# Patient Record
Sex: Male | Born: 1980 | Race: White | Hispanic: No | Marital: Married | Smoking: Never smoker
Health system: Southern US, Community
[De-identification: ages and names within clinical notes are randomized; demographics above are authoritative.]

## PROBLEM LIST (undated history)

## (undated) DIAGNOSIS — I10 Essential (primary) hypertension: Secondary | ICD-10-CM

## (undated) HISTORY — PX: WISDOM TOOTH EXTRACTION: SHX21

---

## 2011-06-05 ENCOUNTER — Encounter (HOSPITAL_COMMUNITY): Payer: Self-pay | Admitting: *Deleted

## 2011-06-05 ENCOUNTER — Emergency Department (HOSPITAL_COMMUNITY)
Admission: EM | Admit: 2011-06-05 | Discharge: 2011-06-05 | Disposition: A | Discharge status: Home / Self Care | Payer: No Typology Code available for payment source | Attending: Emergency Medicine | Admitting: Emergency Medicine

## 2011-06-05 ENCOUNTER — Emergency Department (HOSPITAL_COMMUNITY): Payer: No Typology Code available for payment source

## 2011-06-05 DIAGNOSIS — S161XXA Strain of muscle, fascia and tendon at neck level, initial encounter: Secondary | ICD-10-CM

## 2011-06-05 DIAGNOSIS — M549 Dorsalgia, unspecified: Secondary | ICD-10-CM | POA: Insufficient documentation

## 2011-06-05 DIAGNOSIS — S139XXA Sprain of joints and ligaments of unspecified parts of neck, initial encounter: Secondary | ICD-10-CM | POA: Insufficient documentation

## 2011-06-05 DIAGNOSIS — M542 Cervicalgia: Secondary | ICD-10-CM | POA: Insufficient documentation

## 2011-06-05 MED ORDER — IBUPROFEN 800 MG PO TABS: 800.0000 mg | ORAL_TABLET | Freq: Three times a day (TID) | ORAL | Status: AC

## 2011-06-05 MED ORDER — CYCLOBENZAPRINE HCL 10 MG PO TABS: 10.0000 mg | ORAL_TABLET | Freq: Three times a day (TID) | ORAL | Status: AC | PRN
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
## 2011-06-05 NOTE — ED Provider Notes (Signed)
Medical screening examination/treatment/procedure(s) were performed by non-physician practitioner and as supervising physician I was immediately available for consultation/collaboration.  Shavaughn Seidl R. Fernande Treiber, MD 06/05/11 2344 

## 2011-06-05 NOTE — Discharge Instructions (Signed)
Cervical Sprain and Strain °A cervical sprain is an injury to the neck. The injury can include either over-stretching or even small tears in the ligaments that hold the bones of the neck in place. A strain affects muscles and tendons. Minor injuries usually only involve ligaments and muscles. Because the different parts of the neck are so close together, more severe injuries can involve both sprain and strain. These injuries can affect the muscles, ligaments, tendons, discs, and nerves in the neck. °CAUSES  °An injury may be the result of a direct blow or from certain habits that can lead to the symptoms noted above. °· Injury from:  °· Contact sports (such as football, rugby, wrestling, hockey, auto racing, gymnastics, diving, martial arts, and boxing).  °· Motor vehicle accidents.  °· Whiplash injuries (see image at right). These are common. They occur when the neck is forcefully whipped or forced backward and/or forward.  °· Falls.  °· Lifestyle or awkward postures:  °· Cradling a telephone between the ear and shoulder.  °· Sitting in a chair that offers no support.  °· Working at an ill-designed computer station.  °· Activities that require hours of repeated or long periods of looking up (stretching the neck backward) or looking down (bending the head/neck forward).  °SYMPTOMS  °· Pain, soreness, stiffness, or burning sensation in the front, back, or sides of the neck. This may develop immediately after injury. Onset of discomfort may also develop slowly and not begin for 24 hours or more.  °· Shoulder and/or upper back pain.  °· Limits to the normal movement of the neck.  °· Headache.  °· Dizziness.  °· Weakness and/or abnormal sensation (such as numbness or tingling) of one or both arms and/or hands.  °· Muscle spasm.  °· Difficulty with swallowing or chewing.  °· Tenderness and swelling at the injury site.  °DIAGNOSIS  °Most of the time, your caregiver can diagnose this problem with a careful history and  examination. The history will include information about known problems (such as arthritis in the neck) or a previous neck injury. X-rays may be ordered to find out if there is a different problem. X-rays can also help to find problems with the bones of the neck not related to the injury or current symptoms. °TREATMENT  °Several treatment options are available to help pain, spasm, and other symptoms. They include: °· Cold helps relieve pain and reduce inflammation. Cold should be applied for 10 to 15 minutes every 2 to 3 hours after any activity that aggravates your symptoms. Use ice packs or an ice massage. Place a towel or cloth in between your skin and the ice pack.  °· Medication:  °· Only take over-the-counter or prescription medicines for pain, discomfort, or fever as directed by your caregiver.  °· Pain relievers or muscle relaxants may be prescribed. Use only as directed and only as much as you need.  °· Change in the activity that caused the problem. This might include using a headset with a telephone so that the phone is not propped between your ear and shoulder.  °· Neck collar. Your caregiver may recommend temporary use of a soft cervical collar.  °· Work station. Changes may be needed in your work place. A better sitting position and/or better posture during work may be part of your treatment.  °· Physical Therapy. Your caregiver may recommend physical therapy. This can include instructions in the use of stretching and strengthening exercises. Improvement in posture is important.   Exercises and posture training can help stabilize the neck and strengthen muscles and keep symptoms from returning.  °HOME CARE INSTRUCTIONS  °Other than formal physical therapy, all treatments above can be done at home. Even when not at work, it is important to be conscious of your posture and of activities that can cause a return of symptoms. °Most cervical sprains and/or strains are better in 1-3 weeks. As you improve and  increase activities, doing a warm up and stretching before the activity will help prevent recurrent problems. °SEEK MEDICAL CARE IF:  °· Pain is not effectively controlled with medication.  °· You feel unable to decrease pain medication over time as planned.  °· Activity level is not improving as planned and/or expected.  °SEEK IMMEDIATE MEDICAL CARE IF:  °· While using medication, you develop any bleeding, stomach upset, or signs of an allergic reaction.  °· Symptoms get worse, become intolerable, and are not helped by medications.  °· New, unexplained symptoms develop.  °· You experience numbness, tingling, weakness, or paralysis of any part of your body.  °MAKE SURE YOU:  °· Understand these instructions.  °· Will watch your condition.  °· Will get help right away if you are not doing well or get worse.  °Document Released: 01/20/2007 Document Revised: 12/05/2010 Document Reviewed: 01/20/2007 °ExitCare® Patient Information ©2012 [employer]. °
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
## 2016-01-25 ENCOUNTER — Encounter: Payer: Self-pay | Admitting: Allergy & Immunology

## 2016-01-25 ENCOUNTER — Encounter (INDEPENDENT_AMBULATORY_CARE_PROVIDER_SITE_OTHER): Payer: Self-pay

## 2016-01-25 ENCOUNTER — Ambulatory Visit (INDEPENDENT_AMBULATORY_CARE_PROVIDER_SITE_OTHER): Payer: 59 | Admitting: Allergy & Immunology

## 2016-01-25 VITALS — BP 152/94 | HR 88 | Temp 98.7°F | Resp 18 | Ht 70.0 in | Wt 201.8 lb

## 2016-01-25 DIAGNOSIS — J302 Other seasonal allergic rhinitis: Secondary | ICD-10-CM | POA: Diagnosis not present

## 2016-01-25 DIAGNOSIS — T63481D Toxic effect of venom of other arthropod, accidental (unintentional), subsequent encounter: Secondary | ICD-10-CM | POA: Diagnosis not present

## 2016-01-25 NOTE — Progress Notes (Signed)
NEW PATIENT  Date of Service/Encounter:  01/25/16   Assessment:   Insect stings, accidental or unintentional, subsequent encounter - Plan: Stinging insect panel IgE, Tryptase  Other seasonal allergic rhinitis - Plan: Allergy Test    Plan/Recommendations:   1. Seasonal allergic rhinitis - Testing today showed: Positive to grasses, ragweed, seizure, a few molds, cat, horse, and cockroach - Continue with as needed antihistamines (Allegra and cetirizine are a bit stronger than Claritin, but use whatever works for you)  - You can add a nasal steroid such as Flonase if your symptoms worsen (one spray per nostril daily) - There does not seem to be any indication for environmental allergen immunotherapy as his symptoms are not severe.  2. Insect stings, accidental or unintentional, subsequent encounter - We will send blood work to look for stinging insect allergies since there is a Consulting civil engineer of testing extracts.  - Take your EpiPen with you at all times. - We can talk about allergy shots if the testing is positive. - We did talk briefly about the venom immunotherapy process. - It provides protection against 1-2 stings, otherwise he will still need his epinephrine. - Typically, this is a lifelong therapy as the immunologic effects are less permanent and with environmental immunotherapy.  - This becomes more of a risk versus benefit discussion, as patient is at low risk of stings might not require venom immunotherapy. - Although his career keeps him indoors, he does spend quite a bit of time outdoors in his down time. - Will send serum tryptase today to ensure that he does not have mastocytosis, as there is a higher prevalence of mastocytosis in stinging insect populations.   3. Return in about 6 months (around 07/25/2016) in [place]    Subjective:   Jet Armbrust is a 35 y.o. male presenting today for evaluation of  Chief Complaint  Patient presents with  . Allergic  Reaction    F/U from [employer]  .  Rasmus Preusser has a history of the following: There are no active problems to display for this patient.   History obtained from: chart review and patient.  Laddie Aquas was referred by Rory Percy, MD.     Shawon is a 35 y.o. male presenting for insect sting reaction. He was stung by an insect in the middle of September. He was cutting grass and got stung by a yellow jacket. He looked around and then continued to [facility]. Then he developed generealized pruritis within 20 seconds. He started to black out and then he hobbled inside and went to the shower. He then blacked out again. His wife thankfully was at home and was able to revive him. He asked his wife to call the doctor, and he walked outside and collapsed on the porch. His wife called 57 and EMS came to revive him. The time course between the sting and his blacking out was proximally 15 minutes. He was transferred to the ER and given steroids as well as antihistamines. He was given epinephrine in the field. He was sent home with an EpiPen. In the ER, he was monitored for 4-5 hours before discharge. Prior to this, he had been stung around 2 months ago without problem. He was on several acres of land against stopping periodically each growing season. He has never had a reaction similar to this.  [place] does have a history of seasonal allergies. He takes Claritin as needed. He has never used a nasal steroid. He has never been  allergy tested. His symptoms include itchy nose and nasal congestion. He has no history of asthma. He has no history of urticaria or eczema. He denies problem with flushing his diarrhea and vomiting.  Otherwise, there is no history of other atopic diseases, including asthma, drug allergies, food allergies, or urticaria. There is no significant infectious history. Vaccinations are up to date.    Past Medical History: There are no active problems to display for this patient.   Medication List:      Medication List       Accurate as of 01/25/16  1:46 PM. Always use your most recent med list.          EPINEPHrine 0.3 mg/0.3 mL Soaj injection Commonly known as:  EPI-PEN Inject 0.3 mg into the muscle as needed.       Birth History: non-contributory. Born at term without complications.   Developmental History: [place] has met all milestones on time. He has required no speech therapy, occupational therapy, or physical therapy.   Past Surgical History: Past Surgical History:  Procedure Laterality Date  . WISDOM TOOTH EXTRACTION       Family History: History reviewed. No pertinent family history.   Social History: Aldridge lives at home with His wife, 98-year-old son, and 58-year-old son. There are no animals. 11 house that is 36 years old. There is hardwood in the main living area is carpeting in the bedrooms. They have gas heating and central cooling. A do not have dust mite covers on there pillows. There is no smoke exposure. 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 He does not spend a total of time outdoors, but he does live on several acres and will do yard work on the weekends.   Review of Systems: a 14-point review of systems is pertinent for what is mentioned in HPI.  Otherwise, all other systems were negative. Constitutional: negative other than that listed in the HPI Eyes: negative other than that listed in the HPI Ears, nose, mouth, throat, and face: negative other than that listed in the HPI Respiratory: negative other than that listed in the HPI Cardiovascular: negative other than that listed in the HPI Gastrointestinal: negative other than that listed in the HPI Genitourinary: negative other than that listed in the HPI Integument: negative other than that listed in the HPI Hematologic: negative other than that listed in the HPI Musculoskeletal: negative other than that listed in the HPI Neurological: negative other than that  listed in the HPI Allergy/Immunologic: negative other than that listed in the HPI    Objective:   Blood pressure (!) 152/94, pulse 88, temperature 98.7 F (37.1 C), temperature source Oral, resp. rate 18, height _0  (1.778 m), weight 201 lb 12.8 oz (91.5 kg), SpO2 97 %. Body mass index is 28.96 kg/m.   Physical Exam:  General: Alert, interactive, in no acute distress. Very friendly and smiling. HEENT: TMs pearly gray, turbinates edematous without discharge, post-pharynx mildly erythematous. Neck: Supple without thyromegaly. Adenopathy: no enlarged lymph nodes appreciated in the anterior cervical, occipital, axillary, epitrochlear, inguinal, or popliteal regions Lungs: Clear to auscultation without wheezing, rhonchi or rales. No increased work of breathing. CV: Normal S1/S2, no murmurs. Capillary refill <2 seconds.  Abdomen: Nondistended, nontender. No guarding or rebound tenderness. Bowel sounds faint and hypoactive  Skin: Warm and dry, without lesions or rashes. No dermatographism present.  Extremities:  No clubbing, cyanosis or edema. Neuro:   Grossly intact. No  focal deficits noted.  Diagnostic studies:   Allergy Studies:   Indoor/Outdoor Percutaneous Adult Environmental Panel: Positive to grass, weeds, Cedar, multiple molds, cat, horse, and cockroach.    Salvatore Marvel, MD [facility]

## 2016-01-25 NOTE — Patient Instructions (Addendum)
1. Seasonal allergic rhinitis - Testing today showed: Positive to grasses, ragweed, seizure, a few molds, cat, horse, and cockroach - Continue with as needed antihistamines (Allegra and cetirizine are a bit stronger than Claritin, but use whatever works for you)  - You can add a nasal steroid such as Flonase if your symptoms worsen (one spray per nostril daily)  2. Insect stings, accidental or unintentional, subsequent encounter - We will send blood work to look for stinging insect allergies. - Take your EpiPen with you at all times. - We can talk about allergy shots if the testing is positive.  3. Return in about 6 months (around 07/25/2016). in [place]  Please inform us of any Emergency Department visits, hospitalizations, or changes in symptoms. Call us before going to the ED for breathing or allergy symptoms since we might be able to fit you in for a sick visit. Feel free to contact us anytime with any questions, problems, or concerns.  It was a pleasure to meet you today!   Websites that have reliable patient information: 1. American Academy of Asthma, Allergy, and Immunology: www.aaaai.org 2. Food Allergy Research and Education (FARE): foodallergy.org 3. Mothers of Asthmatics: http://www.asthmacommunitynetwork.org 4. American College of Allergy, Asthma, and Immunology: www.acaai.org  Control of Mold Allergen  Mold and fungi can grow on a variety of surfaces provided certain temperature and moisture conditions exist.  Outdoor molds grow on plants, decaying vegetation and soil.  The major outdoor mold, Alternaria and Cladosporium, are found in very high numbers during hot and dry conditions.  Generally, a late Summer - Fall peak is seen for common outdoor fungal spores.  Rain will temporarily lower outdoor mold spore count, but counts rise rapidly when the rainy period ends.  The most important indoor molds are Aspergillus and Penicillium.  Dark, humid and poorly ventilated basements  are ideal sites for mold growth.  The next most common sites of mold growth are the bathroom and the kitchen.  Outdoor [employer] 1. Use air conditioning and keep windows closed 2. Avoid exposure to decaying vegetation. 3. Avoid leaf raking. 4. Avoid grain handling. 5. Consider wearing a face mask if working in moldy areas.  Indoor Mold Control 1. Maintain humidity below 50%. 2. Clean washable surfaces with 5% bleach solution. 3. Remove sources e.g. contaminated carpets.  Reducing Pollen Exposure  The American Academy of Allergy, Asthma and Immunology suggests the following steps to reduce your exposure to pollen during allergy seasons.    1. Do not hang sheets or clothing out to dry; pollen may collect on these items. 2. Do not mow lawns or spend time around freshly cut grass; mowing stirs up pollen. 3. Keep windows closed at night.  Keep car windows closed while driving. 4. Minimize morning activities outdoors, a time when pollen counts are usually at their highest. 5. Stay indoors as much as possible when pollen counts or humidity is high and on windy days when pollen tends to remain in the air longer. 6. Use air conditioning when possible.  Many air conditioners have filters that trap the pollen spores. 7. Use a HEPA room air filter to remove pollen form the indoor air you breathe.  Control of Cockroach Allergen  Cockroach allergen has been identified as an important cause of acute attacks of asthma, especially in urban settings.  There are fifty-five species of cockroach that exist in the [place], however only three, the [place], [place] species produce allergen that can affect patients with Asthma.  Allergens can be obtained from fecal particles, egg casings and secretions from cockroaches.    1. Remove food sources. 2. Reduce access to water. 3. Seal access and entry points. 4. Spray runways with 0.5-1% Diazinon or Chlorpyrifos 5. Blow boric acid power  under stoves and refrigerator. 6. Place bait stations (hydramethylnon) at feeding sites.

## 2016-01-26 LAB — TRYPTASE: TRYPTASE: 2.4 ug/L (ref 2.2–13.2)

## 2016-02-06 LAB — SPECIMEN STATUS REPORT

## 2016-02-06 LAB — ALLERGEN STINGING INSECT PANEL
I002-IGE HORNET, WHITE FACE: 9.32 kU/L — AB
I003-IGE YELLOW JACKET: 32.9 kU/L — AB
I004-IGE PAPER WASP: 12.2 kU/L — AB
I005-IGE HORNET, YELLOW: 3.96 kU/L — AB

## 2019-06-24 ENCOUNTER — Ambulatory Visit: Payer: Self-pay | Attending: Internal Medicine

## 2019-06-24 DIAGNOSIS — Z23 Encounter for immunization: Secondary | ICD-10-CM

## 2019-06-24 NOTE — Progress Notes (Signed)
   Covid-19 Vaccination Clinic  Name:  John Montoya    MRN: [id number] DOB: 1981-03-01  06/24/2019  John Montoya was observed post Covid-19 immunization for 15 minutes without incident. He was provided with Vaccine Information Sheet and instruction to access the V-Safe system.   John Montoya was instructed to call 911 with any severe reactions post vaccine: Marland Kitchen Difficulty breathing  . Swelling of face and throat  . A fast heartbeat  . A bad rash all over body  . Dizziness and weakness   Immunizations Administered    Name Date Dose VIS Date Route   Pfizer COVID-19 Vaccine 06/24/2019  9:04 AM 0.3 mL 03/19/2019 Intramuscular   Manufacturer: [place]   Lot: [id number]   [place]: [id number]

## 2019-07-19 ENCOUNTER — Ambulatory Visit: Payer: Self-pay | Attending: Internal Medicine

## 2019-07-19 DIAGNOSIS — Z23 Encounter for immunization: Secondary | ICD-10-CM

## 2019-07-19 NOTE — Progress Notes (Signed)
   Covid-19 Vaccination Clinic  Name:  Jeshuah Bruderer    MRN: [id number] DOB: May 12, 1980  07/19/2019  Mr. Eckerson was observed post Covid-19 immunization for 15 minutes without incident. He was provided with Vaccine Information Sheet and instruction to access the V-Safe system.   Mr. Navarette was instructed to call 911 with any severe reactions post vaccine: Marland Kitchen Difficulty breathing  . Swelling of face and throat  . A fast heartbeat  . A bad rash all over body  . Dizziness and weakness   Immunizations Administered    Name Date Dose VIS Date Route   Pfizer COVID-19 Vaccine 07/19/2019  8:42 AM 0.3 mL 03/19/2019 Intramuscular   Manufacturer: [place]   Lot: [id number]   [place]: [id number]

## 2020-05-30 ENCOUNTER — Encounter (INDEPENDENT_AMBULATORY_CARE_PROVIDER_SITE_OTHER): Payer: Self-pay | Admitting: *Deleted

## 2020-08-28 ENCOUNTER — Other Ambulatory Visit: Payer: Self-pay

## 2020-08-28 ENCOUNTER — Telehealth (INDEPENDENT_AMBULATORY_CARE_PROVIDER_SITE_OTHER): Payer: 59 | Admitting: Gastroenterology

## 2020-08-28 ENCOUNTER — Encounter (INDEPENDENT_AMBULATORY_CARE_PROVIDER_SITE_OTHER): Payer: Self-pay | Admitting: Gastroenterology

## 2020-08-28 ENCOUNTER — Ambulatory Visit (INDEPENDENT_AMBULATORY_CARE_PROVIDER_SITE_OTHER): Payer: Self-pay | Admitting: Gastroenterology

## 2020-08-28 DIAGNOSIS — K625 Hemorrhage of anus and rectum: Secondary | ICD-10-CM | POA: Insufficient documentation

## 2020-08-28 DIAGNOSIS — Z86010 Personal history of colonic polyps: Secondary | ICD-10-CM

## 2020-08-28 NOTE — Patient Instructions (Signed)
Schedule colonoscopy

## 2020-08-28 NOTE — Progress Notes (Signed)
Maylon Peppers, M.D. Gastroenterology & Hepatology [facility] [address] [place], [place] [address] Primary Care Physician: Rory Percy, MD [place] [id number]  Referring MD: PCP  This is a telephone virtual visit.  It required patient-provider interaction for the medical decision making as documented below. The patient has consented and agreed to proceed with a Telehealth encounter given the current Coronavirus pandemic.  VIRTUAL VISIT NOTE Patient location: home  Provider location: home  I will communicate my assessment and recommendations to the referring MD via EMR.  Chief Complaint:  Rectal bleeding  History of Present Illness: John Montoya is a 40 y.o. male with no significant past medical history, who presents for evaluation of rectal bleeding.  Patient reports that when he was 40 years old he presented new onset  rectal bleeding. He had a colonoscopy at that time and reports he had some benign polyps removed. No report is available.  He states that he was told at that time it was likely due to hemorrhoids. States that since then he has presented intermittent episodes of rectal bleeding throughout his life. States that he has presented intermittent fresh large amount of bleeding for multiple years, but has noticed that it is heavier in amount compared to previous years. Last episode was 3 months ago and lasted for almost a week.  The patient states that he has had 2 colonoscopies after his initial colonoscopy, with last one performed 12 years ago. He believes he had some polyps but no report is available.  He has a BM 1-2 times a day. Denies any straining when moving his bowels.  He states that in the last couple of months he has presented some episodes of pain after eating, but this lasts for a few minutes. This is located in the periumbilical area. He describes this as a "turning" pain as if he had food  poisoning.  He denies having any significant concern about this abdominal pain.  The patient denies having any nausea, vomiting, fever, chills, hematochezia, melena, hematemesis, abdominal distention,  diarrhea, jaundice, pruritus or weight loss.  Last RSW:NIOEV  FHx: neg for any gastrointestinal/liver disease, colon cancer uncle in his 38s Social: neg smoking, alcohol or illicit drug use Surgical: non contributory  Past Medical History:History reviewed. No pertinent past medical history.  Past Surgical History: Past Surgical History:  Procedure Laterality Date  . WISDOM TOOTH EXTRACTION      Family History:History reviewed. No pertinent family history.  Social History: Social History   Tobacco Use  Smoking Status Never Smoker  Smokeless Tobacco Never Used   Social History   Substance and Sexual Activity  Alcohol Use Yes  . Alcohol/week: 3.0 standard drinks  . Types: 2 Cans of beer, 1 Shots of liquor per week   Comment: socially   Social History   Substance and Sexual Activity  Drug Use No    Allergies: Allergies  Allergen Reactions  . Bee Venom Anaphylaxis    Medications: Current Outpatient Medications  Medication Sig Dispense Refill  . EPINEPHrine 0.3 mg/0.3 mL IJ SOAJ injection Inject 0.3 mg into the muscle as needed.     No current facility-administered medications for this visit.    Review of Systems: GENERAL: negative for malaise, night sweats HEENT: No changes in hearing or vision, no nose bleeds or other nasal problems. NECK: Negative for lumps, goiter, pain and significant neck swelling RESPIRATORY: Negative for cough, wheezing CARDIOVASCULAR: Negative for chest pain, leg swelling,  palpitations, orthopnea GI: SEE HPI MUSCULOSKELETAL: Negative for joint pain or swelling, back pain, and muscle pain. SKIN: Negative for lesions, rash PSYCH: Negative for sleep disturbance, mood disorder and recent psychosocial stressors. HEMATOLOGY Negative for  prolonged bleeding, bruising easily, and swollen nodes. ENDOCRINE: Negative for cold or heat intolerance, polyuria, polydipsia and goiter. NEURO: negative for tremor, gait imbalance, syncope and seizures. The remainder of the review of systems is noncontributory.   Physical Exam: No exam was performed as this was a telephone encounter  Imaging/Labs: as above  I personally reviewed and interpreted the available labs, imaging and endoscopic files.  Impression and Plan: John Montoya is a 40 y.o. male with no significant past medical history, who presents for evaluation of rectal bleeding.  The patient has had longstanding history of rectal bleeding intermittently throughout his life.  He has not presented any other red flag signs and I think this is likely related to some intermittent hemorrhoidal bleeding.  However as he has presented polyps in the past and he has not had colonoscopy in more than 10 years, we will proceed with a repeat colonoscopy.  I explained to the patient that if he has hemorrhoids, he may be a candidate for band ligation versus surgical hemorrhoidectomy but he is not interested in pursuing this as the bleeding is not bothersome for him and his not very frequent, so he would not like to proceed with any therapeutic treatment for possible hemorrhoids.DX  - Schedule colonoscopy All questions were answered.      Total visit time: I spent a total of  35 minutes  Maylon Peppers, MD Gastroenterology and Hepatology [facility]

## 2021-01-01 ENCOUNTER — Other Ambulatory Visit (INDEPENDENT_AMBULATORY_CARE_PROVIDER_SITE_OTHER): Payer: Self-pay

## 2021-01-01 ENCOUNTER — Encounter (INDEPENDENT_AMBULATORY_CARE_PROVIDER_SITE_OTHER): Payer: Self-pay

## 2021-01-01 ENCOUNTER — Telehealth (INDEPENDENT_AMBULATORY_CARE_PROVIDER_SITE_OTHER): Payer: Self-pay

## 2021-01-01 DIAGNOSIS — K625 Hemorrhage of anus and rectum: Secondary | ICD-10-CM

## 2021-01-01 MED ORDER — PEG 3350-KCL-NA BICARB-NACL 420 G PO SOLR: 4000.0000 mL | ORAL | 0 refills | Status: DC

## 2021-01-01 NOTE — Telephone Encounter (Signed)
John Montoya, CMA  

## 2021-01-24 ENCOUNTER — Encounter (HOSPITAL_COMMUNITY)
Admission: RE | Disposition: A | Discharge status: Home / Self Care | Payer: Self-pay | Source: Home / Self Care | Attending: Gastroenterology

## 2021-01-24 ENCOUNTER — Other Ambulatory Visit: Payer: Self-pay

## 2021-01-24 ENCOUNTER — Ambulatory Visit (HOSPITAL_COMMUNITY)
Admission: RE | Admit: 2021-01-24 | Discharge: 2021-01-24 | Disposition: A | Discharge status: Home / Self Care | Payer: 59 | Attending: Gastroenterology | Admitting: Gastroenterology

## 2021-01-24 ENCOUNTER — Ambulatory Visit (HOSPITAL_COMMUNITY): Payer: 59 | Admitting: Anesthesiology

## 2021-01-24 ENCOUNTER — Encounter (HOSPITAL_COMMUNITY): Payer: Self-pay | Admitting: Gastroenterology

## 2021-01-24 DIAGNOSIS — D122 Benign neoplasm of ascending colon: Secondary | ICD-10-CM | POA: Insufficient documentation

## 2021-01-24 DIAGNOSIS — Z791 Long term (current) use of non-steroidal anti-inflammatories (NSAID): Secondary | ICD-10-CM | POA: Insufficient documentation

## 2021-01-24 DIAGNOSIS — K5730 Diverticulosis of large intestine without perforation or abscess without bleeding: Secondary | ICD-10-CM | POA: Diagnosis not present

## 2021-01-24 DIAGNOSIS — Z79899 Other long term (current) drug therapy: Secondary | ICD-10-CM | POA: Insufficient documentation

## 2021-01-24 DIAGNOSIS — K625 Hemorrhage of anus and rectum: Secondary | ICD-10-CM | POA: Insufficient documentation

## 2021-01-24 DIAGNOSIS — D124 Benign neoplasm of descending colon: Secondary | ICD-10-CM | POA: Diagnosis not present

## 2021-01-24 DIAGNOSIS — K648 Other hemorrhoids: Secondary | ICD-10-CM | POA: Diagnosis not present

## 2021-01-24 DIAGNOSIS — D120 Benign neoplasm of cecum: Secondary | ICD-10-CM | POA: Diagnosis not present

## 2021-01-24 DIAGNOSIS — D1339 Benign neoplasm of other parts of small intestine: Secondary | ICD-10-CM

## 2021-01-24 DIAGNOSIS — Z91030 Bee allergy status: Secondary | ICD-10-CM | POA: Insufficient documentation

## 2021-01-24 HISTORY — PX: POLYPECTOMY: SHX149

## 2021-01-24 HISTORY — PX: HEMOSTASIS CLIP PLACEMENT: SHX6857

## 2021-01-24 HISTORY — PX: COLONOSCOPY WITH PROPOFOL: SHX5780

## 2021-01-24 LAB — HM COLONOSCOPY

## 2021-01-24 SURGERY — COLONOSCOPY WITH PROPOFOL
Anesthesia: General

## 2021-01-24 MED ORDER — PROPOFOL 1000 MG/100ML IV EMUL
INTRAVENOUS | Status: AC
  Filled 2021-01-24: qty 100

## 2021-01-24 MED ORDER — LACTATED RINGERS IV SOLN: INTRAVENOUS | Status: DC

## 2021-01-24 MED ORDER — PROPOFOL 500 MG/50ML IV EMUL
INTRAVENOUS | Status: DC | PRN
  Administered 2021-01-24: 150 ug/kg/min via INTRAVENOUS

## 2021-01-24 MED ORDER — PROPOFOL 10 MG/ML IV BOLUS
INTRAVENOUS | Status: DC | PRN
  Administered 2021-01-24: 100 mg via INTRAVENOUS

## 2021-01-24 NOTE — H&P (Signed)
John Montoya is an 40 y.o. male.   Chief Complaint: Rectal bleeding HPI: John Montoya is a 40 y.o. male with no significant past medical history, who presents for evaluation of rectal bleeding.  Patient reports that for the last 10 years he has presented intermittent episodes of fresh blood in his stool with no dyschezia or melena.  He denies having any abdominal pain, nausea, vomiting, fever, chills, weight loss, abdominal distention.  The patient states that he has had 2 colonoscopies after his initial colonoscopy, with last one performed 12 years ago. He believes he had some polyps but no report is available.  History reviewed. No pertinent past medical history.  Past Surgical History:  Procedure Laterality Date   WISDOM TOOTH EXTRACTION      History reviewed. No pertinent family history. Social History:  reports that he has never smoked. He has never used smokeless tobacco. He reports current alcohol use of about 3.0 standard drinks per week. He reports that he does not use drugs.  Allergies:  Allergies  Allergen Reactions   Bee Venom Anaphylaxis    Medications Prior to Admission  Medication Sig Dispense Refill   Aspirin-Acetaminophen-Caffeine 500-325-65 MG PACK Take by mouth.     busPIRone (BUSPAR) 5 MG tablet Take 5 mg by mouth 2 (two) times daily.     EPINEPHrine 0.3 mg/0.3 mL IJ SOAJ injection Inject 0.3 mg into the muscle as needed for anaphylaxis.     lisinopril (ZESTRIL) 20 MG tablet Take 20 mg by mouth daily.     polyethylene glycol-electrolytes (TRILYTE) 420 g solution Take 4,000 mLs by mouth as directed. 4000 mL 0   acetaminophen (TYLENOL) 500 MG tablet Take 1,000 mg by mouth every 8 (eight) hours as needed for headache.     ibuprofen (ADVIL) 200 MG tablet Take 400 mg by mouth every 8 (eight) hours as needed for headache.      No results found for this or any previous visit (from the past 48 hour(s)). No results found.  Review of Systems  Constitutional: Negative.    HENT: Negative.    Eyes: Negative.   Respiratory: Negative.    Cardiovascular: Negative.   Gastrointestinal:  Positive for blood in stool.  Endocrine: Negative.   Genitourinary: Negative.   Musculoskeletal: Negative.   Skin: Negative.   Allergic/Immunologic: Negative.   Neurological: Negative.   Hematological: Negative.   Psychiatric/Behavioral: Negative.     Blood pressure (!) 155/98, pulse 87, temperature 98.1 F (36.7 C), temperature source Oral, resp. rate 19, height 6' (1.829 m), weight 92.5 kg, SpO2 100 %. Physical Exam  GENERAL: The patient is AO x3, in no acute distress. HEENT: Head is normocephalic and atraumatic. EOMI are intact. Mouth is well hydrated and without lesions. NECK: Supple. No masses LUNGS: Clear to auscultation. No presence of rhonchi/wheezing/rales. Adequate chest expansion HEART: RRR, normal s1 and s2. ABDOMEN: Soft, nontender, no guarding, no peritoneal signs, and nondistended. BS +. No masses. EXTREMITIES: Without any cyanosis, clubbing, rash, lesions or edema. NEUROLOGIC: AOx3, no focal motor deficit. SKIN: no jaundice, no rashes  Assessment/Plan John Montoya is a 40 y.o. male with no significant past medical history, who presents for evaluation of rectal bleeding.  We will proceed with colonoscopy.  Harvel Quale, MD 01/24/2021, 8:48 AM

## 2021-01-24 NOTE — Anesthesia Postprocedure Evaluation (Signed)
Anesthesia Post Note  Patient: John Montoya  Procedure(s) Performed: COLONOSCOPY WITH PROPOFOL POLYPECTOMY INTESTINAL HEMOSTASIS CLIP PLACEMENT  Patient location during evaluation: Endoscopy Anesthesia Type: General Level of consciousness: awake Pain management: pain level controlled Vital Signs Assessment: post-procedure vital signs reviewed and stable Respiratory status: spontaneous breathing Cardiovascular status: blood pressure returned to baseline and stable Postop Assessment: no apparent nausea or vomiting Anesthetic complications: no   No notable events documented.   Last Vitals:  Vitals:   01/24/21 0815  BP: (!) 155/98  Pulse: 87  Resp: 19  Temp: 36.7 C  SpO2: 100%    Last Pain:  Vitals:   01/24/21 0952  TempSrc: Oral  PainSc: 0-No pain                 Dajane Valli

## 2021-01-24 NOTE — Discharge Instructions (Addendum)
-   Discharge patient to home (ambulatory).  - Resume previous diet.  - Await pathology results.  - Repeat colonoscopy in 2 years for surveillance.  - May consider referral for genetic counseling depending on pathology report. - Can start taking Miralax daily to loosen bowel movement

## 2021-01-24 NOTE — Transfer of Care (Signed)
Immediate Anesthesia Transfer of Care Note  Patient: John Montoya  Procedure(s) Performed: COLONOSCOPY WITH PROPOFOL POLYPECTOMY INTESTINAL HEMOSTASIS CLIP PLACEMENT  Patient Location: Endoscopy Unit  Anesthesia Type:General  Level of Consciousness: awake  Airway & Oxygen Therapy: Patient Spontanous Breathing  Post-op Assessment: Report given to RN  Post vital signs: Reviewed  Last Vitals:  Vitals Value Taken Time  BP    Temp    Pulse    Resp    SpO2      Last Pain:  Vitals:   01/24/21 0952  TempSrc: Oral  PainSc: 0-No pain      Patients Stated Pain Goal: 7 (52/71/29 2909)  Complications: No notable events documented.

## 2021-01-24 NOTE — Anesthesia Preprocedure Evaluation (Signed)

## 2021-01-24 NOTE — Op Note (Signed)
[facility] Patient Name: John Montoya Procedure Date: 01/24/2021 8:45 AM MRN: [id number] Date of Birth: February 29, 1981 Attending MD: Maylon Peppers ,  CSN: [id number] Age: 40 Admit Type: Outpatient Procedure:                Colonoscopy Indications:              Rectal bleeding Providers:                Maylon Peppers, [place] Page, [place] Risa Grill, Technician Referring MD:              Medicines:                Monitored Anesthesia Care Complications:            No immediate complications. Estimated Blood Loss:     Estimated blood loss: none. Procedure:                Pre-Anesthesia Assessment:                           - Prior to the procedure, a History and Physical                            was performed, and patient medications, allergies                            and sensitivities were reviewed. The patient's                            tolerance of previous anesthesia was reviewed.                           - The risks and benefits of the procedure and the                            sedation options and risks were discussed with the                            patient. All questions were answered and informed                            consent was obtained.                           - ASA Grade Assessment: I - A normal, healthy                            patient.                           After obtaining informed consent, the colonoscope                            was passed under direct vision. Throughout the  procedure, the patient's blood pressure, pulse, and                            oxygen saturations were monitored continuously. The                            [id number]-HQ190L ([id number]) scope was introduced through                            the anus and advanced to the the terminal ileum.                            The colonoscopy was performed without difficulty.                            The patient tolerated  the procedure well. The                            quality of the bowel preparation was good. Scope In: 9:07:35 AM Scope Out: 9:46:45 AM Scope Withdrawal Time: 0 hours 32 minutes 57 seconds  Total Procedure Duration: 0 hours 39 minutes 10 seconds  Findings:      The perianal and digital rectal examinations were normal.      The terminal ileum appeared normal.      Six sessile polyps were found in the cecum. The polyps were 2 to 4 mm in       size. These polyps were removed with a cold snare. Resection and       retrieval were complete.      Ten semi-sessile polyps were found in the cecum and ileocecal valve. The       polyps were 1 to 2 mm in size. These polyps were removed with a cold       biopsy forceps. Resection and retrieval were complete.      A 8 mm polyp was found in the ascending colon. The polyp was       semi-sessile. The polyp was removed with a cold snare. Resection and       retrieval were complete.      A 20 mm polyp was found in the descending colon. The polyp was       pedunculated. To prevent bleeding post-intervention, one hemostatic clip       was successfully placed at the base of the stalk. The polyp was removed       with a hot snare. Resection and retrieval were complete. There was no       bleeding at the end of the procedure.      A few small-mouthed diverticula were found in the sigmoid colon and       descending colon.      Non-bleeding internal hemorrhoids were found during retroflexion. The       hemorrhoids were small. Impression:               - The examined portion of the ileum was normal.                           - Six 2 to 4 mm polyps in the cecum, removed with a  cold snare. Resected and retrieved.                           - Ten 1 to 2 mm polyps in the cecum and at the                            ileocecal valve, removed with a cold biopsy                            forceps. Resected and retrieved.                            - One 8 mm polyp in the ascending colon, removed                            with a cold snare. Resected and retrieved.                           - One 20 mm polyp in the descending colon, removed                            with a hot snare. Resected and retrieved. Clip was                            placed.                           - Diverticulosis in the sigmoid colon and in the                            descending colon.                           - Non-bleeding internal hemorrhoids. Moderate Sedation:      Per Anesthesia Care Recommendation:           - Discharge patient to home (ambulatory).                           - Resume previous diet.                           - Await pathology results.                           - Repeat colonoscopy in 2 years for surveillance.                           - May consider referral for genetic counseling                            depending on pathology report. Procedure Code(s):        --- Professional ---                           [phone], Colonoscopy, flexible;  with removal of                            tumor(s), polyp(s), or other lesion(s) by snare                            technique                           45380, 59, Colonoscopy, flexible; with biopsy,                            single or multiple Diagnosis Code(s):        --- Professional ---                           K64.8, Other hemorrhoids                           K63.5, Polyp of colon                           K62.5, Hemorrhage of anus and rectum                           K57.30, Diverticulosis of large intestine without                            perforation or abscess without bleeding CPT copyright 2019 American Medical Association. All rights reserved. The codes documented in this report are preliminary and upon coder review may  be revised to meet current compliance requirements. Maylon Peppers, MD Maylon Peppers,  01/24/2021 9:57:18 AM This report has been signed  electronically. Number of Addenda: 0

## 2021-01-25 LAB — SURGICAL PATHOLOGY

## 2021-01-26 ENCOUNTER — Encounter (HOSPITAL_COMMUNITY): Payer: Self-pay | Admitting: Gastroenterology

## 2021-01-27 ENCOUNTER — Observation Stay (HOSPITAL_COMMUNITY)
Admission: EM | Admit: 2021-01-27 | Discharge: 2021-01-28 | Disposition: A | Discharge status: Home / Self Care | Payer: 59 | Attending: Internal Medicine | Admitting: Internal Medicine

## 2021-01-27 ENCOUNTER — Other Ambulatory Visit: Payer: Self-pay

## 2021-01-27 ENCOUNTER — Encounter (HOSPITAL_COMMUNITY): Payer: Self-pay | Admitting: Emergency Medicine

## 2021-01-27 DIAGNOSIS — K625 Hemorrhage of anus and rectum: Principal | ICD-10-CM | POA: Insufficient documentation

## 2021-01-27 DIAGNOSIS — K922 Gastrointestinal hemorrhage, unspecified: Secondary | ICD-10-CM

## 2021-01-27 DIAGNOSIS — Z20822 Contact with and (suspected) exposure to covid-19: Secondary | ICD-10-CM | POA: Insufficient documentation

## 2021-01-27 DIAGNOSIS — I10 Essential (primary) hypertension: Secondary | ICD-10-CM | POA: Diagnosis not present

## 2021-01-27 DIAGNOSIS — Z79899 Other long term (current) drug therapy: Secondary | ICD-10-CM | POA: Diagnosis not present

## 2021-01-27 LAB — COMPREHENSIVE METABOLIC PANEL
ALT: 37 U/L (ref 0–44)
AST: 26 U/L (ref 15–41)
Albumin: 4 g/dL (ref 3.5–5.0)
Alkaline Phosphatase: 70 U/L (ref 38–126)
Anion gap: 6 (ref 5–15)
BUN: 15 mg/dL (ref 6–20)
CO2: 28 mmol/L (ref 22–32)
Calcium: 8.7 mg/dL — ABNORMAL LOW (ref 8.9–10.3)
Chloride: 103 mmol/L (ref 98–111)
Creatinine, Ser: 1.2 mg/dL (ref 0.61–1.24)
GFR, Estimated: >60 mL/min (ref >60)
Glucose, Bld: 100 mg/dL — ABNORMAL HIGH (ref 70–99)
Potassium: 4 mmol/L (ref 3.5–5.1)
Sodium: 137 mmol/L (ref 135–145)
Total Bilirubin: 0.7 mg/dL (ref 0.3–1.2)
Total Protein: 7.1 g/dL (ref 6.5–8.1)

## 2021-01-27 LAB — HIV ANTIBODY (ROUTINE TESTING W REFLEX): HIV Screen 4th Generation wRfx: NONREACTIVE

## 2021-01-27 LAB — TYPE AND SCREEN
ABO/RH(D): A POS
Antibody Screen: NEGATIVE

## 2021-01-27 LAB — CBC
HCT: 44.5 % (ref 39.0–52.0)
Hemoglobin: 15.3 g/dL (ref 13.0–17.0)
MCH: 30.1 pg (ref 26.0–34.0)
MCHC: 34.4 g/dL (ref 30.0–36.0)
MCV: 87.4 fL (ref 80.0–100.0)
Platelets: 215 10*3/uL (ref 150–400)
RBC: 5.09 MIL/uL (ref 4.22–5.81)
RDW: 12.4 % (ref 11.5–15.5)
WBC: 5.3 10*3/uL (ref 4.0–10.5)
nRBC: 0 % (ref 0.0–0.2)

## 2021-01-27 LAB — RESP PANEL BY RT-PCR (FLU A&B, COVID) ARPGX2
Influenza A by PCR: NEGATIVE
Influenza B by PCR: NEGATIVE
SARS Coronavirus 2 by RT PCR: NEGATIVE

## 2021-01-27 MED ORDER — ONDANSETRON HCL 4 MG PO TABS: 4.0000 mg | ORAL_TABLET | Freq: Four times a day (QID) | ORAL | Status: DC | PRN

## 2021-01-27 MED ORDER — ONDANSETRON HCL 4 MG/2ML IJ SOLN: 4.0000 mg | Freq: Four times a day (QID) | INTRAMUSCULAR | Status: DC | PRN

## 2021-01-27 MED ORDER — LISINOPRIL 10 MG PO TABS
20.0000 mg | ORAL_TABLET | Freq: Every day | ORAL | Status: DC
  Administered 2021-01-27 – 2021-01-28 (×2): 20 mg via ORAL
  Filled 2021-01-27 (×2): qty 2

## 2021-01-27 MED ORDER — SODIUM CHLORIDE 0.9 % IV SOLN: 250.0000 mL | INTRAVENOUS | Status: DC | PRN

## 2021-01-27 MED ORDER — SODIUM CHLORIDE 0.9% FLUSH: 3.0000 mL | INTRAVENOUS | Status: DC | PRN

## 2021-01-27 MED ORDER — BUSPIRONE HCL 5 MG PO TABS
5.0000 mg | ORAL_TABLET | Freq: Two times a day (BID) | ORAL | Status: DC
  Administered 2021-01-27: 5 mg via ORAL
  Filled 2021-01-27 (×3): qty 1

## 2021-01-27 MED ORDER — HYDRALAZINE HCL 20 MG/ML IJ SOLN: 10.0000 mg | INTRAMUSCULAR | Status: DC | PRN

## 2021-01-27 MED ORDER — ACETAMINOPHEN 500 MG PO TABS
1000.0000 mg | ORAL_TABLET | Freq: Three times a day (TID) | ORAL | Status: DC | PRN
  Administered 2021-01-27: 1000 mg via ORAL
  Filled 2021-01-27: qty 2

## 2021-01-27 MED ORDER — SODIUM CHLORIDE 0.9 % IV SOLN: INTRAVENOUS | Status: DC

## 2021-01-27 MED ORDER — SODIUM CHLORIDE 0.9% FLUSH
3.0000 mL | Freq: Two times a day (BID) | INTRAVENOUS | Status: DC
  Administered 2021-01-27: 3 mL via INTRAVENOUS

## 2021-01-27 NOTE — ED Provider Notes (Signed)
[facility] EMERGENCY DEPARTMENT Provider Note   CSN: [id number] Arrival date & time: 01/27/21  0818     History Chief Complaint  Patient presents with   Rectal Bleeding    John Montoya is a 40 y.o. male.  Patient c/o rectal bleeding acute onset this AM, moderate, recurrent. States had colonoscopy 10/19. States hx intermittent small amount brbpr, internal hemorrhoids, and colon polyps - notes three prior colonoscopies. Had no rectal bleeding prior to recent colonoscopy, but now is having new bleeding, that is more than the minimal amount of bleeding he's had previously. No faintness or dizziness. No constant and/or focal abd pain. No abd distension. No nausea/vomiting. No fever or chills. No anticoag use. No other abnormal bruising or bleeding. Recent colonoscopy with removal of several polyps and with clip applied to larger polyp, as well as demonstration diverticula and internal hemorrhoids.   The history is provided by the patient and medical records.  Rectal Bleeding Associated symptoms: no fever, no light-headedness and no vomiting       History reviewed. No pertinent past medical history.  Patient Active Problem List   Diagnosis Date Noted   Rectal bleeding 08/28/2020   History of colonic polyps 08/28/2020    Past Surgical History:  Procedure Laterality Date   COLONOSCOPY WITH PROPOFOL N/A 01/24/2021   Procedure: COLONOSCOPY WITH PROPOFOL;  Surgeon: Harvel Quale, MD;  Location: [facility] ENDO SUITE;  Service: Gastroenterology;  Laterality: N/A;  9:15   HEMOSTASIS CLIP PLACEMENT  01/24/2021   Procedure: HEMOSTASIS CLIP PLACEMENT;  Surgeon: Harvel Quale, MD;  Location: [facility] ENDO SUITE;  Service: Gastroenterology;;   POLYPECTOMY  01/24/2021   Procedure: POLYPECTOMY INTESTINAL;  Surgeon: Harvel Quale, MD;  Location: [facility] ENDO SUITE;  Service: Gastroenterology;;   WISDOM TOOTH EXTRACTION         No family history on file.  Social History    Tobacco Use   Smoking status: Never   Smokeless tobacco: Never  Vaping Use   Vaping Use: Never used  Substance Use Topics   Alcohol use: Yes    Alcohol/week: 3.0 standard drinks    Types: 2 Cans of beer, 1 Shots of liquor per week    Comment: socially   Drug use: No    Home Medications Prior to Admission medications   Medication Sig Start Date End Date Taking? Authorizing Provider  acetaminophen (TYLENOL) 500 MG tablet Take 1,000 mg by mouth every 8 (eight) hours as needed for headache.   Yes [provider]  busPIRone (BUSPAR) 5 MG tablet Take 5 mg by mouth 2 (two) times daily.   Yes [provider]  ibuprofen (ADVIL) 200 MG tablet Take 400 mg by mouth every 8 (eight) hours as needed for headache.   Yes [provider]  lisinopril (ZESTRIL) 20 MG tablet Take 20 mg by mouth daily.   Yes [provider]  EPINEPHrine 0.3 mg/0.3 mL IJ SOAJ injection Inject 0.3 mg into the muscle as needed for anaphylaxis. 12/23/15   [provider]    Allergies    Bee venom  Review of Systems   Review of Systems  Constitutional:  Negative for fever.  HENT:  Negative for sore throat.   Eyes:  Negative for redness.  Respiratory:  Negative for shortness of breath.   Cardiovascular:  Negative for chest pain.  Gastrointestinal:  Positive for blood in stool and hematochezia. Negative for vomiting.  Genitourinary:  Negative for flank pain and hematuria.  Musculoskeletal:  Negative for  back pain.  Skin:  Negative for rash.  Neurological:  Negative for syncope and light-headedness.  Hematological:  Does not bruise/bleed easily.  Psychiatric/Behavioral:  Negative for confusion.    Physical Exam Updated Vital Signs BP (!) 156/100 (BP Location: Left Arm)   Pulse 81   Temp 97.8 F (36.6 C) (Oral)   Resp 18   Ht 1.829 m (6')   Wt 90.7 kg   SpO2 100%   BMI 27.12 kg/m   Physical Exam Vitals and nursing note reviewed.  Constitutional:       Appearance: Normal appearance. He is well-developed.  HENT:     Head: Atraumatic.     Nose: Nose normal.     Mouth/Throat:     Mouth: Mucous membranes are moist.     Pharynx: Oropharynx is clear.  Eyes:     General: No scleral icterus.    Conjunctiva/sclera: Conjunctivae normal.  Neck:     Trachea: No tracheal deviation.  Cardiovascular:     Rate and Rhythm: Normal rate.     Pulses: Normal pulses.  Pulmonary:     Effort: Pulmonary effort is normal. No accessory muscle usage or respiratory distress.  Abdominal:     General: Bowel sounds are normal. There is no distension.     Palpations: Abdomen is soft. There is no mass.     Tenderness: There is no abdominal tenderness. There is no guarding.  Genitourinary:    Comments: No cva tenderness. Musculoskeletal:        General: No swelling.     Cervical back: Normal range of motion.  Skin:    General: Skin is warm and dry.     Findings: No rash.  Neurological:     Mental Status: He is alert.     Comments: Alert, speech clear.   Psychiatric:        Mood and Affect: Mood normal.    ED Results / Procedures / Treatments   Labs (all labs ordered are listed, but only abnormal results are displayed) Results for orders placed or performed during the hospital encounter of 01/27/21  CBC  Result Value Ref Range   WBC 5.3 4.0 - 10.5 K/uL   RBC 5.09 4.22 - 5.81 MIL/uL   Hemoglobin 15.3 13.0 - 17.0 g/dL   HCT 44.5 39.0 - 52.0 %   MCV 87.4 80.0 - 100.0 fL   MCH 30.1 26.0 - 34.0 pg   MCHC 34.4 30.0 - 36.0 g/dL   RDW 12.4 11.5 - 15.5 %   Platelets 215 150 - 400 K/uL   nRBC 0.0 0.0 - 0.2 %  Comprehensive metabolic panel  Result Value Ref Range   Sodium 137 135 - 145 mmol/L   Potassium 4.0 3.5 - 5.1 mmol/L   Chloride 103 98 - 111 mmol/L   CO2 28 22 - 32 mmol/L   Glucose, Bld 100 (H) 70 - 99 mg/dL   BUN 15 6 - 20 mg/dL   Creatinine, Ser 1.20 0.61 - 1.24 mg/dL   Calcium 8.7 (L) 8.9 - 10.3 mg/dL   Total Protein 7.1 6.5 - 8.1 g/dL    Albumin 4.0 3.5 - 5.0 g/dL   AST 26 15 - 41 U/L   ALT 37 0 - 44 U/L   Alkaline Phosphatase 70 38 - 126 U/L   Total Bilirubin 0.7 0.3 - 1.2 mg/dL   GFR, Estimated >60 >60 mL/min   Anion gap 6 5 - 15     EKG None  Radiology No  results found.  Procedures Procedures   Medications Ordered in ED Medications - No data to display  ED Course  I have reviewed the triage vital signs and the nursing notes.  Pertinent labs & imaging results that were available during my care of the patient were reviewed by me and considered in my medical decision making (see chart for details).    MDM Rules/Calculators/A&P                           Iv ns. Stat labs.   Reviewed nursing notes and prior charts for additional history.   Labs reviewed/interpreted by me - hgb 15, normal.  Consulted GI - discussed pt with Dr Laural Golden - he requests admit to medicine, clear liquid diet for now, he will see.   Hospitalists consulted for admission.         Final Clinical Impression(s) / ED Diagnoses Final diagnoses:  None    Rx / DC Orders ED Discharge Orders     None        Lajean Saver, MD 01/27/21 1035

## 2021-01-27 NOTE — ED Triage Notes (Signed)
Pt states he had colonoscopy on 10/19 had polyps removed and staples placed. Pt woke this morning with abd cramping and multiple bloody stools.

## 2021-01-27 NOTE — H&P (Signed)
History and Physical    Regis Hinton JQB:[id number] DOB: 05-03-1980 DOA: 01/27/2021  PCP: Lavella Lemons, PA   Patient coming from: Home  Chief Complaint: Rectal bleed  HPI: John Montoya is a 40 y.o. male with medical history significant for mild anxiety and hypertension who presented to the ED today after noticing several episodes of rectal bleeding over the last 24 hours.  He has had a colonoscopy on 10/19 with over 20 polyps removed with hot and cold snare.  He denies any other symptomatology aside from some lower abdominal cramping.  He has had 5 episodes of bleeding this morning and denies any fevers, chills, nausea, or vomiting.   ED Course: Vital signs with hypertension noted.  Hemoglobin 15.3.  EDP has discussed with GI with plans to monitor hemoglobin and consider repeat endoscopy as needed.  Review of Systems: Reviewed as noted above, otherwise negative.  History reviewed. No pertinent past medical history.  Past Surgical History:  Procedure Laterality Date   COLONOSCOPY WITH PROPOFOL N/A 01/24/2021   Procedure: COLONOSCOPY WITH PROPOFOL;  Surgeon: Harvel Quale, MD;  Location: [facility];  Service: Gastroenterology;  Laterality: N/A;  9:15   HEMOSTASIS CLIP PLACEMENT  01/24/2021   Procedure: HEMOSTASIS CLIP PLACEMENT;  Surgeon: Harvel Quale, MD;  Location: [facility];  Service: Gastroenterology;;   POLYPECTOMY  01/24/2021   Procedure: POLYPECTOMY INTESTINAL;  Surgeon: Harvel Quale, MD;  Location: [facility];  Service: Gastroenterology;;   WISDOM TOOTH EXTRACTION       reports that he has never smoked. He has never used smokeless tobacco. He reports current alcohol use of about 3.0 standard drinks per week. He reports that he does not use drugs.  Allergies  Allergen Reactions   Bee Venom Anaphylaxis    No family history on file.  Prior to Admission medications   Medication Sig Start Date End Date Taking? Authorizing  Provider  acetaminophen (TYLENOL) 500 MG tablet Take 1,000 mg by mouth every 8 (eight) hours as needed for headache.   Yes [provider]  busPIRone (BUSPAR) 5 MG tablet Take 5 mg by mouth 2 (two) times daily.   Yes [provider]  ibuprofen (ADVIL) 200 MG tablet Take 400 mg by mouth every 8 (eight) hours as needed for headache.   Yes [provider]  lisinopril (ZESTRIL) 20 MG tablet Take 20 mg by mouth daily.   Yes [provider]  EPINEPHrine 0.3 mg/0.3 mL IJ SOAJ injection Inject 0.3 mg into the muscle as needed for anaphylaxis. 12/23/15   [provider]    Physical Exam: Vitals:   01/27/21 0837 01/27/21 0841  BP: (!) 156/100   Pulse: 81   Resp: 18   Temp: 97.8 F (36.6 C)   TempSrc: Oral   SpO2: 100%   Weight:  90.7 kg  Height:  6' (1.829 m)    Constitutional: NAD, calm, comfortable Vitals:   01/27/21 0837 01/27/21 0841  BP: (!) 156/100   Pulse: 81   Resp: 18   Temp: 97.8 F (36.6 C)   TempSrc: Oral   SpO2: 100%   Weight:  90.7 kg  Height:  6' (1.829 m)   Eyes: lids and conjunctivae normal Neck: normal, supple Respiratory: clear to auscultation bilaterally. Normal respiratory effort. No accessory muscle use.  Cardiovascular: Regular rate and rhythm, no murmurs. Abdomen: no tenderness, no distention. Bowel sounds positive.  Musculoskeletal:  No edema. Skin: no rashes, lesions, ulcers.  Psychiatric: Flat affect  Labs on Admission: I have personally reviewed following labs and imaging studies  CBC: Recent Labs  Lab 01/27/21 0904  WBC 5.3  HGB 15.3  HCT 44.5  MCV 87.4  PLT 027   Basic Metabolic Panel: Recent Labs  Lab 01/27/21 0904  NA 137  K 4.0  CL 103  CO2 28  GLUCOSE 100*  BUN 15  CREATININE 1.20  CALCIUM 8.7*   GFR: Estimated Creatinine Clearance: 88.9 mL/min (by C-G formula based on SCr of 1.2 mg/dL). Liver Function Tests: Recent Labs  Lab 01/27/21 0904  AST 26  ALT 37  ALKPHOS 70   BILITOT 0.7  PROT 7.1  ALBUMIN 4.0   No results for input(s): LIPASE, AMYLASE in the last 168 hours. No results for input(s): AMMONIA in the last 168 hours. Coagulation Profile: No results for input(s): INR, PROTIME in the last 168 hours. Cardiac Enzymes: No results for input(s): CKTOTAL, CKMB, CKMBINDEX, TROPONINI in the last 168 hours. BNP (last 3 results) No results for input(s): PROBNP in the last 8760 hours. HbA1C: No results for input(s): HGBA1C in the last 72 hours. CBG: No results for input(s): GLUCAP in the last 168 hours. Lipid Profile: No results for input(s): CHOL, HDL, LDLCALC, TRIG, CHOLHDL, LDLDIRECT in the last 72 hours. Thyroid Function Tests: No results for input(s): TSH, T4TOTAL, FREET4, T3FREE, THYROIDAB in the last 72 hours. Anemia Panel: No results for input(s): VITAMINB12, FOLATE, FERRITIN, TIBC, IRON, RETICCTPCT in the last 72 hours. Urine analysis: No results found for: COLORURINE, APPEARANCEUR, LABSPEC, PHURINE, GLUCOSEU, HGBUR, BILIRUBINUR, KETONESUR, PROTEINUR, UROBILINOGEN, NITRITE, LEUKOCYTESUR  Radiological Exams on Admission: No results found.   Assessment/Plan Active Problems:   GI bleed    Rectal bleeding in the setting of recent colonoscopy with polypectomy -Hemoglobin currently 15.3, continue to monitor -Clear liquid diet -Appreciate GI evaluation -Transfuse for hemoglobin less than 7 -SCDs  Hypertension-uncontrolled -Takes lisinopril at bedtime which will be continued -IV hydralazine as needed for now  Mild anxiety -Continue BuSpar   DVT prophylaxis: SCDs Code Status: Full Family Communication:None at bedside  Disposition Plan:Admit for GI evaluation, hemoglobin monitoring Consults called:GI Admission status:Obs, MedSurg    [name] D Alleyne Lac DO [facility]  If 7PM-7AM, please contact night-coverage [email or web address]  01/27/2021, 11:27 AM

## 2021-01-27 NOTE — Consult Note (Signed)
Referring Provider: Heath Lark, DO Primary Care Physician:  Lavella Lemons, PA Primary Gastroenterologist:  Dr. Jenetta Downer  Reason for Consultation:    Abdominal pain and rectal bleeding  HPI:   Patient is 40 year old Caucasian male who has history of colonic polyps dating back to age 42 or 82.  His second colonoscopy was 6 years later and third colonoscopy was 15 years ago.  He did have polyps on 1 or 2 of the subsequent colonoscopies.  He had been recommended to return for follow-up exam in 5 years by Dr. Lindalou Hose. Patient was seen in our office by Dr. Jenetta Downer on 08/28/2020 for rectal bleeding.  Dr. Jenetta Downer felt bleeding was most likely hemorrhoidal but recommended colonoscopy given his history. He underwent colonoscopy 3 days ago.  He had multiple small polyps cold snared from cecal region and these are inflammatory.  He had 8 mm cold snare from ascending colon and a 20 mm polyp was hot snared from the ascending colon.  This polyp was pedunculated.  He ended up using 1 clip.  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  Patient states he did fine until yesterday when he noted cramping across lower abdomen.  He did take 2 Advil tablets day of the procedure.  Yesterday he had a normal bowel movement.  He woke up around 7:00 this morning and had an urge to have a bowel movement.  He passed some stool but bright red blood along with it.  He had 3 more bowel movements and he noted fresh blood.  He took pictures with his phone which he shared with me.  Last bowel movement was around 10:30 AM.  Now he has mild discomfort in hypogastric region.  He denies nausea vomiting fever or chills.  He is hungry.  He does not take aspirin. His hemoglobin in the emergency room was 15.3.  He is married and has 2 children.  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  He has 2 children.  He has never smoked cigarettes and  drinks alcohol socially no more than couple of drinks a month.  He does not do any regular physical activity.  Family history significant for coronary artery disease in his father who had CABG at 63.  He is 37 years old and doing fine.  Mother smokes but does not have any health issues.  He has a brother who has polycythemia vera.  He has a sister who is in good health. Maternal uncle had colon cancer in his 69s and is doing fine.   Past medical history  History of colonic polyps since age 31 or 86. Hypertension.   Past Surgical History:  Procedure Laterality Date   COLONOSCOPY WITH PROPOFOL N/A 01/24/2021   Procedure: COLONOSCOPY WITH PROPOFOL;  Surgeon: Harvel Quale, MD;  Location: [facility];  Service: Gastroenterology;  Laterality: N/A;  9:15   HEMOSTASIS CLIP PLACEMENT  01/24/2021   Procedure: HEMOSTASIS CLIP PLACEMENT;  Surgeon: Harvel Quale, MD;  Location: [facility];  Service: Gastroenterology;;   POLYPECTOMY  01/24/2021   Procedure: POLYPECTOMY INTESTINAL;  Surgeon: Harvel Quale, MD;  Location: [facility];  Service: Gastroenterology;;   WISDOM TOOTH EXTRACTION      Prior to Admission medications   Medication Sig Start Date End Date Taking? Authorizing Provider  acetaminophen (TYLENOL) 500 MG tablet Take 1,000 mg by mouth every 8 (eight) hours as needed for headache.  Yes [provider]  busPIRone (BUSPAR) 5 MG tablet Take 5 mg by mouth 2 (two) times daily.   Yes [provider]  ibuprofen (ADVIL) 200 MG tablet Take 400 mg by mouth every 8 (eight) hours as needed for headache.   Yes [provider]  lisinopril (ZESTRIL) 20 MG tablet Take 20 mg by mouth daily.   Yes [provider]  EPINEPHrine 0.3 mg/0.3 mL IJ SOAJ injection Inject 0.3 mg into the muscle as needed for anaphylaxis. 12/23/15   [provider]    Current Facility-Administered Medications  Medication Dose Route Frequency  Provider Last Rate Last Admin   0.9 %  sodium chloride infusion  250 mL Intravenous PRN Manuella Ghazi, Pratik D, DO       0.9 %  sodium chloride infusion   Intravenous Continuous Jatavian Calica, Mechele Dawley, MD       acetaminophen (TYLENOL) tablet 1,000 mg  1,000 mg Oral Q8H PRN Manuella Ghazi, Pratik D, DO   1,000 mg at 01/27/21 1219   busPIRone (BUSPAR) tablet 5 mg  5 mg Oral BID Manuella Ghazi, Pratik D, DO       hydrALAZINE (APRESOLINE) injection 10 mg  10 mg Intravenous Q4H PRN Manuella Ghazi, Pratik D, DO       lisinopril (ZESTRIL) tablet 20 mg  20 mg Oral Daily Manuella Ghazi, Pratik D, DO   20 mg at 01/27/21 1219   ondansetron (ZOFRAN) tablet 4 mg  4 mg Oral Q6H PRN Manuella Ghazi, Pratik D, DO       Or   ondansetron (ZOFRAN) injection 4 mg  4 mg Intravenous Q6H PRN Manuella Ghazi, Pratik D, DO       sodium chloride flush (NS) 0.9 % injection 3 mL  3 mL Intravenous Q12H Shah, Pratik D, DO   3 mL at 01/27/21 1220   sodium chloride flush (NS) 0.9 % injection 3 mL  3 mL Intravenous PRN Manuella Ghazi, Pratik D, DO        Allergies as of 01/27/2021 - Review Complete 01/27/2021  Allergen Reaction Noted   Bee venom Anaphylaxis 01/25/2016    History reviewed. No pertinent family history.  Social History   Socioeconomic History   Marital status: Married    Spouse name: Not on file   Number of children: Not on file   Years of education: Not on file   Highest education level: Not on file  Occupational History   Not on file  Tobacco Use   Smoking status: Never   Smokeless tobacco: Never  Vaping Use   Vaping Use: Never used  Substance and Sexual Activity   Alcohol use: Yes    Alcohol/week: 3.0 standard drinks    Types: 2 Cans of beer, 1 Shots of liquor per week    Comment: socially   Drug use: No   Sexual activity: Yes    Partners: Female    Birth control/protection: None  Other Topics Concern   Not on file  Social History Narrative   Not on file   Social Determinants of Health   Financial Resource Strain: Not on file  Food Insecurity: Not on file   Transportation Needs: Not on file  Physical Activity: Not on file  Stress: Not on file  Social Connections: Not on file  Intimate Partner Violence: Not on file    Review of Systems: See HPI, otherwise normal ROS  Physical Exam: Temp:  [97.8 F (36.6 C)-98.4 F (36.9 C)] 98.4 F (36.9 C) (10/22 1318) Pulse Rate:  [81-98] 95 (10/22 1318)  Resp:  [18] 18 (10/22 1318) BP: (124-156)/(95-105) 131/104 (10/22 1318) SpO2:  [99 %-100 %] 100 % (10/22 1318) Weight:  [90.7 kg-98.6 kg] 98.6 kg (10/22 1318) Last BM Date: 01/27/21  Patient is alert and in no acute distress. Conjunctiva is pink.  Sclera is nonicteric. Oropharyngeal mucosa is normal. Neck without masses thyromegaly or lymphadenopathy. Cardiac exam with regular rhythm normal S1 and S2.  No murmur gallop noted. Auscultation lungs reveal vesicular breath sounds bilaterally. Abdomen is full.  Bowel sounds are normal.  On palpation he has mild tenderness in hypogastric region on deep palpation.  No guarding.  No organomegaly or masses. No peripheral edema or clubbing noted.  Intake/Output from previous day: No intake/output data recorded. Intake/Output this shift: No intake/output data recorded.  Lab Results: Recent Labs    01/27/21 0904  WBC 5.3  HGB 15.3  HCT 44.5  PLT 215   BMET Recent Labs    01/27/21 0904  NA 137  K 4.0  CL 103  CO2 28  GLUCOSE 100*  BUN 15  CREATININE 1.20  CALCIUM 8.7*   LFT Recent Labs    01/27/21 0904  PROT 7.1  ALBUMIN 4.0  AST 26  ALT 37  ALKPHOS 70  BILITOT 0.7   PT/INR No results for input(s): LABPROT, INR in the last 72 hours. Hepatitis Panel No results for input(s): HEPBSAG, HCVAB, HEPAIGM, HEPBIGM in the last 72 hours.  Studies/Results: No results found.  Assessment;  Patient is 40 year old Caucasian male who underwent surveillance colonoscopy 3 days ago with removal of multiple small polyps along with 2 large polyps.  He now presents with bright red blood per  rectum he has had 4 episodes since 7:00 this morning.  He also has been experiencing cramping pain across his lower abdomen which started yesterday. Patient is hemodynamically stable.  His hemoglobin admission was 15.3 g. He has post polypectomy bleed.  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 He has not had a bowel movement in the last 6 hours.  Therefore it is possible that he has stopped bleeding.  He will be monitored for the next 24 hours.  If there is evidence of recurrent bleed would proceed with colonoscopy.  Recommendations;  Continue with clear liquids. Begin normal saline at 75 mL/h. Monitor H&H and recurrent bleed. Nursing staff advised to contact me if patient has another bloody bowel movement.   LOS: 0 days   Kaydence Baba  01/27/2021, 4:42 PM

## 2021-01-27 NOTE — ED Notes (Signed)
Pt provided clear soda and is aware of clear liquid diet only. Denies any other needs at this time.

## 2021-01-27 NOTE — Progress Notes (Signed)
Patient has not had bowel movement since speaking with Dr. Laural Golden this morining. Verbalized this with Dr. Laural Golden who stated plan to progress diet to full liquid if patient does not have any bloody bowel movements tonight.

## 2021-01-28 DIAGNOSIS — K922 Gastrointestinal hemorrhage, unspecified: Secondary | ICD-10-CM | POA: Diagnosis not present

## 2021-01-28 LAB — BASIC METABOLIC PANEL
Anion gap: 5 (ref 5–15)
BUN: 11 mg/dL (ref 6–20)
CO2: 28 mmol/L (ref 22–32)
Calcium: 8.6 mg/dL — ABNORMAL LOW (ref 8.9–10.3)
Chloride: 103 mmol/L (ref 98–111)
Creatinine, Ser: 1.3 mg/dL — ABNORMAL HIGH (ref 0.61–1.24)
GFR, Estimated: >60 mL/min (ref >60)
Glucose, Bld: 92 mg/dL (ref 70–99)
Potassium: 3.9 mmol/L (ref 3.5–5.1)
Sodium: 136 mmol/L (ref 135–145)

## 2021-01-28 LAB — CBC
HCT: 40.7 % (ref 39.0–52.0)
Hemoglobin: 13.8 g/dL (ref 13.0–17.0)
MCH: 30.2 pg (ref 26.0–34.0)
MCHC: 33.9 g/dL (ref 30.0–36.0)
MCV: 89.1 fL (ref 80.0–100.0)
Platelets: 201 10*3/uL (ref 150–400)
RBC: 4.57 MIL/uL (ref 4.22–5.81)
RDW: 12.7 % (ref 11.5–15.5)
WBC: 7.2 10*3/uL (ref 4.0–10.5)
nRBC: 0 % (ref 0.0–0.2)

## 2021-01-28 LAB — MAGNESIUM: Magnesium: 2 mg/dL (ref 1.7–2.4)

## 2021-01-28 NOTE — Progress Notes (Signed)
Pt has discharge orders, discharge teaching given and no further questions at this time. Pt tolerated diet at lunch time well.

## 2021-01-28 NOTE — Discharge Summary (Signed)
Physician Discharge Summary  Roch Quach EHU:[id number] DOB: November 28, 1980 DOA: 01/27/2021  PCP: Lavella Lemons, PA  Admit date: 01/27/2021  Discharge date: 01/28/2021  Admitted From:Home  Disposition:  Home  Recommendations for Outpatient Follow-up:  Follow up with PCP in 1-2 weeks Return to ED if bleeding worsens or persists Continue on other home medications as prior  Home Health: None  Equipment/Devices: None  Discharge Condition:Stable  CODE STATUS: Full  Diet recommendation: Heart Healthy  Brief/Interim Summary:  John Montoya is a 40 y.o. male with medical history significant for mild anxiety and hypertension who presented to the ED today after noticing several episodes of rectal bleeding over the last 24 hours.  He has had a colonoscopy on 10/19 with over 20 polyps removed with hot and cold snare.  Patient was seen by GI and has not had any further rectal bleeding during the course of this admission, but he has had no further bowel movements either.  He has been seen by GI and is noted to have a fairly stable hemoglobin level and stable vital signs.  There is no indication for repeat colonoscopy at this time and he is tolerating diet.  He has been told to come back to the ED if he has any further bleeding noted.  He continues to have some mild abdominal cramping at this time, but is reassured that this will resolve over time.  No other acute events noted this admission.  Discharge Diagnoses:  Active Problems:   GI bleed  Principal discharge diagnosis: Post polypectomy bleeding.  Discharge Instructions  Discharge Instructions     Diet - low sodium heart healthy   Complete by: As directed    Increase activity slowly   Complete by: As directed       Allergies as of 01/28/2021       Reactions   Bee Venom Anaphylaxis        Medication List     TAKE these medications    acetaminophen 500 MG tablet Commonly known as: TYLENOL Take 1,000 mg by mouth every 8  (eight) hours as needed for headache.   busPIRone 5 MG tablet Commonly known as: BUSPAR Take 5 mg by mouth 2 (two) times daily.   EPINEPHrine 0.3 mg/0.3 mL Soaj injection Commonly known as: EPI-PEN Inject 0.3 mg into the muscle as needed for anaphylaxis.   ibuprofen 200 MG tablet Commonly known as: ADVIL Take 400 mg by mouth every 8 (eight) hours as needed for headache.   lisinopril 20 MG tablet Commonly known as: ZESTRIL Take 20 mg by mouth daily.        Follow-up Information     Lavella Lemons, [place]. Schedule an appointment as soon as possible for a visit in 1 week(s).   Specialty: Physician Assistant Contact information: [address] [place] [address] [phone]                Allergies  Allergen Reactions   Bee Venom Anaphylaxis    Consultations: GI   Procedures/Studies: No results found.   Discharge Exam: Vitals:   01/27/21 2053 01/28/21 0454  BP: (!) 151/96 (!) 134/91  Pulse: 76 79  Resp: 16 16  Temp: 97.9 F (36.6 C) 97.7 F (36.5 C)  SpO2: 100% 100%   Vitals:   01/27/21 1318 01/27/21 1743 01/27/21 2053 01/28/21 0454  BP: (!) 131/104 128/84 (!) 151/96 (!) 134/91  Pulse: 95 70 76 79  Resp: 18 18 16 16   Temp: 98.4 F (36.9  C) 97.8 F (36.6 C) 97.9 F (36.6 C) 97.7 F (36.5 C)  TempSrc: Oral Oral    SpO2: 100% 100% 100% 100%  Weight: 98.6 kg     Height: 6' (1.829 m)       General: Pt is alert, awake, not in acute distress Cardiovascular: RRR, S1/S2 +, no rubs, no gallops Respiratory: CTA bilaterally, no wheezing, no rhonchi Abdominal: Soft, NT, ND, bowel sounds + Extremities: no edema, no cyanosis    The results of significant diagnostics from this hospitalization (including imaging, microbiology, ancillary and laboratory) are listed below for reference.     Microbiology: Recent Results (from the past 240 hour(s))  Resp Panel by RT-PCR (Flu A&B, Covid) Nasopharyngeal Swab     Status: None   Collection Time:  01/27/21 12:25 PM   Specimen: Nasopharyngeal Swab; Nasopharyngeal(NP) swabs in vial transport medium  Result Value Ref Range Status   SARS Coronavirus 2 by RT PCR NEGATIVE NEGATIVE Final    Comment: (NOTE) SARS-CoV-2 target nucleic acids are NOT DETECTED.  The SARS-CoV-2 RNA is generally detectable in upper respiratory specimens during the acute phase of infection. The lowest concentration of SARS-CoV-2 viral copies this assay can detect is 138 copies/mL. A negative result does not preclude SARS-Cov-2 infection and should not be used as the sole basis for treatment or other patient management decisions. A negative result may occur with  improper specimen collection/handling, submission of specimen other than nasopharyngeal swab, presence of viral mutation(s) within the areas targeted by this assay, and inadequate number of viral copies(<138 copies/mL). A negative result must be combined with clinical observations, patient history, and epidemiological information. The expected result is Negative.  Fact Sheet for Patients:  EntrepreneurPulse.com.au  Fact Sheet for Healthcare Providers:  IncredibleEmployment.be  This test is no t yet approved or cleared by the [place] FDA and  has been authorized for detection and/or diagnosis of SARS-CoV-2 by FDA under an Emergency Use Authorization (EUA). This EUA will remain  in effect (meaning this test can be used) for the duration of the COVID-19 declaration under Section 564(b)(1) of the Act, 21 U.S.C.section 360bbb-3(b)(1), unless the authorization is terminated  or revoked sooner.       Influenza A by PCR NEGATIVE NEGATIVE Final   Influenza B by PCR NEGATIVE NEGATIVE Final    Comment: (NOTE) The Xpert Xpress SARS-CoV-2/FLU/RSV plus assay is intended as an aid in the diagnosis of influenza from Nasopharyngeal swab specimens and should not be used as a sole basis for treatment. Nasal washings  and aspirates are unacceptable for Xpert Xpress SARS-CoV-2/FLU/RSV testing.  Fact Sheet for Patients: EntrepreneurPulse.com.au  Fact Sheet for Healthcare Providers: IncredibleEmployment.be  This test is not yet approved or cleared by the [place] FDA and has been authorized for detection and/or diagnosis of SARS-CoV-2 by FDA under an Emergency Use Authorization (EUA). This EUA will remain in effect (meaning this test can be used) for the duration of the COVID-19 declaration under Section 564(b)(1) of the Act, 21 U.S.C. section 360bbb-3(b)(1), unless the authorization is terminated or revoked.  Performed at [facility], [address] [place], [place] [address]      Labs: BNP (last 3 results) No results for input(s): BNP in the last 8760 hours. Basic Metabolic Panel: Recent Labs  Lab 01/27/21 0904 01/28/21 0437  NA 137 136  K 4.0 3.9  CL 103 103  CO2 28 28  GLUCOSE 100* 92  BUN 15 11  CREATININE 1.20 1.30*  CALCIUM 8.7* 8.6*  MG  --  2.0   Liver Function Tests: Recent Labs  Lab 01/27/21 0904  AST 26  ALT 37  ALKPHOS 70  BILITOT 0.7  PROT 7.1  ALBUMIN 4.0   No results for input(s): LIPASE, AMYLASE in the last 168 hours. No results for input(s): AMMONIA in the last 168 hours. CBC: Recent Labs  Lab 01/27/21 0904 01/28/21 0437  WBC 5.3 7.2  HGB 15.3 13.8  HCT 44.5 40.7  MCV 87.4 89.1  PLT 215 201   Cardiac Enzymes: No results for input(s): CKTOTAL, CKMB, CKMBINDEX, TROPONINI in the last 168 hours. BNP: Invalid input(s): POCBNP CBG: No results for input(s): GLUCAP in the last 168 hours. D-Dimer No results for input(s): DDIMER in the last 72 hours. Hgb A1c No results for input(s): HGBA1C in the last 72 hours. Lipid Profile No results for input(s): CHOL, HDL, LDLCALC, TRIG, CHOLHDL, LDLDIRECT in the last 72 hours. Thyroid function studies No results for input(s): TSH, T4TOTAL, T3FREE, THYROIDAB in the last  72 hours.  Invalid input(s): FREET3 Anemia work up No results for input(s): VITAMINB12, FOLATE, FERRITIN, TIBC, IRON, RETICCTPCT in the last 72 hours. Urinalysis No results found for: COLORURINE, APPEARANCEUR, LABSPEC, [place], GLUCOSEU, [place], [place], [place], PROTEINUR, UROBILINOGEN, NITRITE, LEUKOCYTESUR Sepsis Labs Invalid input(s): PROCALCITONIN,  WBC,  LACTICIDVEN Microbiology Recent Results (from the past 240 hour(s))  Resp Panel by RT-PCR (Flu A&B, Covid) Nasopharyngeal Swab     Status: None   Collection Time: 01/27/21 12:25 PM   Specimen: Nasopharyngeal Swab; Nasopharyngeal(NP) swabs in vial transport medium  Result Value Ref Range Status   SARS Coronavirus 2 by RT PCR NEGATIVE NEGATIVE Final    Comment: (NOTE) SARS-CoV-2 target nucleic acids are NOT DETECTED.  The SARS-CoV-2 RNA is generally detectable in upper respiratory specimens during the acute phase of infection. The lowest concentration of SARS-CoV-2 viral copies this assay can detect is 138 copies/mL. A negative result does not preclude SARS-Cov-2 infection and should not be used as the sole basis for treatment or other patient management decisions. A negative result may occur with  improper specimen collection/handling, submission of specimen other than nasopharyngeal swab, presence of viral mutation(s) within the areas targeted by this assay, and inadequate number of viral copies(<138 copies/mL). A negative result must be combined with clinical observations, patient history, and epidemiological information. The expected result is Negative.  Fact Sheet for Patients:  EntrepreneurPulse.com.au  Fact Sheet for Healthcare Providers:  IncredibleEmployment.be  This test is no t yet approved or cleared by the [place] FDA and  has been authorized for detection and/or diagnosis of SARS-CoV-2 by FDA under an Emergency Use Authorization (EUA). This EUA will remain  in  effect (meaning this test can be used) for the duration of the COVID-19 declaration under Section 564(b)(1) of the Act, 21 U.S.C.section 360bbb-3(b)(1), unless the authorization is terminated  or revoked sooner.       Influenza A by PCR NEGATIVE NEGATIVE Final   Influenza B by PCR NEGATIVE NEGATIVE Final    Comment: (NOTE) The Xpert Xpress SARS-CoV-2/FLU/RSV plus assay is intended as an aid in the diagnosis of influenza from Nasopharyngeal swab specimens and should not be used as a sole basis for treatment. Nasal washings and aspirates are unacceptable for Xpert Xpress SARS-CoV-2/FLU/RSV testing.  Fact Sheet for Patients: EntrepreneurPulse.com.au  Fact Sheet for Healthcare Providers: IncredibleEmployment.be  This test is not yet approved or cleared by the [place] FDA and has been authorized for detection and/or diagnosis of SARS-CoV-2 by FDA under  an Emergency Use Authorization (EUA). This EUA will remain in effect (meaning this test can be used) for the duration of the COVID-19 declaration under Section 564(b)(1) of the Act, 21 U.S.C. section 360bbb-3(b)(1), unless the authorization is terminated or revoked.  Performed at [facility], [address], [place], [place] [address]      Time coordinating discharge: 35 minutes  SIGNED:   Rodena Goldmann, DO [facility] 01/28/2021, 11:18 AM  If 7PM-7AM, please contact night-coverage [email or web address]

## 2021-01-28 NOTE — Progress Notes (Signed)
Subjective:  Patient feels much better.  He still having very mild cramping which eases after he passes flatus.  He has passed gas multiple times since admission but he has not passed any more blood per rectum.  His appetite is good.  He is not feeling dizzy or lightheaded when he walks to the bathroom.  Current Medications:  Current Facility-Administered Medications:    0.9 %  sodium chloride infusion, 250 mL, Intravenous, PRN, Manuella Ghazi, Pratik D, DO   0.9 %  sodium chloride infusion, , Intravenous, Continuous, Abdimalik Mayorquin U, MD, Last Rate: 75 mL/hr at 01/28/21 0607, New Bag at 01/28/21 0607   acetaminophen (TYLENOL) tablet 1,000 mg, 1,000 mg, Oral, Q8H PRN, Manuella Ghazi, Pratik D, DO, 1,000 mg at 01/27/21 1219   busPIRone (BUSPAR) tablet 5 mg, 5 mg, Oral, BID, Manuella Ghazi, Pratik D, DO, 5 mg at 01/27/21 2020   hydrALAZINE (APRESOLINE) injection 10 mg, 10 mg, Intravenous, Q4H PRN, Manuella Ghazi, Pratik D, DO   lisinopril (ZESTRIL) tablet 20 mg, 20 mg, Oral, Daily, Manuella Ghazi, Pratik D, DO, 20 mg at 01/28/21 0821   ondansetron (ZOFRAN) tablet 4 mg, 4 mg, Oral, Q6H PRN **OR** ondansetron (ZOFRAN) injection 4 mg, 4 mg, Intravenous, Q6H PRN, Manuella Ghazi, Pratik D, DO   sodium chloride flush (NS) 0.9 % injection 3 mL, 3 mL, Intravenous, Q12H, Shah, Pratik D, DO, 3 mL at 01/27/21 1220   sodium chloride flush (NS) 0.9 % injection 3 mL, 3 mL, Intravenous, PRN, Manuella Ghazi, Pratik D, DO   Objective: Blood pressure (!) 134/91, pulse 79, temperature 97.7 F (36.5 C), resp. rate 16, height 6' (1.829 m), weight 98.6 kg, SpO2 100 %. Patient is alert and in no acute distress. Abdomen is symmetrical bowel sounds are normal and on palpation is soft and nontender with organomegaly or masses.  Labs/studies Results:   CBC Latest Ref Rng & Units 01/28/2021 01/27/2021  WBC 4.0 - 10.5 K/uL 7.2 5.3  Hemoglobin 13.0 - 17.0 g/dL 13.8 15.3  Hematocrit 39.0 - 52.0 % 40.7 44.5  Platelets 150 - 400 K/uL 201 215    CMP Latest Ref Rng & Units 01/28/2021  01/27/2021  Glucose 70 - 99 mg/dL 92 100(H)  BUN 6 - 20 mg/dL 11 15  Creatinine 0.61 - 1.24 mg/dL 1.30(H) 1.20  Sodium 135 - 145 mmol/L 136 137  Potassium 3.5 - 5.1 mmol/L 3.9 4.0  Chloride 98 - 111 mmol/L 103 103  CO2 22 - 32 mmol/L 28 28  Calcium 8.9 - 10.3 mg/dL 8.6(L) 8.7(L)  Total Protein 6.5 - 8.1 g/dL - 7.1  Total Bilirubin 0.3 - 1.2 mg/dL - 0.7  Alkaline Phos 38 - 126 U/L - 70  AST 15 - 41 U/L - 26  ALT 0 - 44 U/L - 37    Hepatic Function Latest Ref Rng & Units 01/27/2021  Total Protein 6.5 - 8.1 g/dL 7.1  Albumin 3.5 - 5.0 g/dL 4.0  AST 15 - 41 U/L 26  ALT 0 - 44 U/L 37  Alk Phosphatase 38 - 126 U/L 70  Total Bilirubin 0.3 - 1.2 mg/dL 0.7     Assessment:  #1.  Post polypectomy bleed.  Patient has not had any more bowel movement in 24 hours.  Hemoglobin has dropped by 1.5 g but still within normal limits.  His baseline appears to be 15 g or over.  No indication for endoscopic intervention.  Plan:  Diet advanced to heart healthy. If he does well he should be able to go home after lunch.  Patient advised to take it easy for the next 2 days.  He can take Tylenol but should not take NSAIDs or aspirin. If bleeding recurs he will report back to emergency room in which case we will proceed with colonoscopy.

## 2021-02-01 ENCOUNTER — Telehealth (INDEPENDENT_AMBULATORY_CARE_PROVIDER_SITE_OTHER): Payer: Self-pay | Admitting: *Deleted

## 2021-02-01 ENCOUNTER — Other Ambulatory Visit (INDEPENDENT_AMBULATORY_CARE_PROVIDER_SITE_OTHER): Payer: Self-pay | Admitting: *Deleted

## 2021-02-01 MED ORDER — DICYCLOMINE HCL 10 MG PO CAPS: 10.0000 mg | ORAL_CAPSULE | Freq: Two times a day (BID) | ORAL | 0 refills | Status: DC

## 2021-02-01 NOTE — Telephone Encounter (Signed)
Pt at ED last weekend with rectal bleeding. States that stopped 10/22 and was discharged on 10/23. No bleeding since but wanted to see if the soreness he is having on left lower side is normal. States it is about a level2 not bad just feels sore but has a sharp pain when coughing or twisting that is about a level 5. States about the same as when he left hospital. No better no worse. No fever. Today is the first day he has not had any abdominal cramping. He is seeing pcp today at 4pm.

## 2021-02-01 NOTE — Telephone Encounter (Signed)
Per dr Laural Golden take bentyl 10mg  one bid for 7 days #60. 0 refills. Call with progress report in one week.  Called pt and discussed and he verbalized understanding. Med sent to pharm.

## 2021-02-02 ENCOUNTER — Encounter (INDEPENDENT_AMBULATORY_CARE_PROVIDER_SITE_OTHER): Payer: Self-pay | Admitting: *Deleted

## 2021-07-26 ENCOUNTER — Ambulatory Visit (INDEPENDENT_AMBULATORY_CARE_PROVIDER_SITE_OTHER): Payer: 59 | Admitting: Gastroenterology

## 2021-07-26 ENCOUNTER — Encounter (INDEPENDENT_AMBULATORY_CARE_PROVIDER_SITE_OTHER): Payer: Self-pay | Admitting: Gastroenterology

## 2021-07-26 VITALS — BP 148/106 | HR 88 | Temp 98.1°F | Ht 72.0 in | Wt 221.0 lb

## 2021-07-26 DIAGNOSIS — K5900 Constipation, unspecified: Secondary | ICD-10-CM | POA: Diagnosis not present

## 2021-07-26 DIAGNOSIS — Z86010 Personal history of colonic polyps: Secondary | ICD-10-CM | POA: Diagnosis not present

## 2021-07-26 NOTE — Patient Instructions (Signed)
Eat prune and/or kiwi daily ?Increase water intake ?Can take Miralax as needed ?

## 2021-07-26 NOTE — Progress Notes (Signed)
Maylon Peppers, M.D. ?Gastroenterology & Hepatology ?[facility] ?[address] ?[place], [place] [address] ? ?Primary Care Physician: ?Lavella Lemons, PA ?[address] ?[place] [address] ? ?I will communicate my assessment and recommendations to the referring MD via EMR. ? ?Problems: ?Constipation ? ?History of Present Illness: ?John Montoya is a 41 y.o. male with no significant past medical history, who presents for evaluation of constipation. ? ?The patient was last seen on 08/28/2020. At that time, the patient was scheduled for a colonoscopy for evaluation of rectal bleeding. ? ?Colonoscopy performed on 01/24/21 showed ?- The examined portion of the ileum was normal. ?- Six 2 to 4 mm polyps in the cecum, removed with a cold snare. Resected and retrieved. ?- Ten 1 to 2 mm polyps in the cecum and at the ileocecal valve, removed with a cold biopsy ?forceps. Resected and retrieved. ?- One 8 mm polyp in the ascending colon, removed with a cold snare. Resected and ?retrieved. ?- One 20 mm polyp in the descending colon, removed with a hot snare. Resected and ?retrieved. Clip was placed. ?- Diverticulosis in the sigmoid colon and in the descending colon. ?- Non-bleeding internal hemorrhoids. ? ?Path: ?A. COLON, CECAL, ILEOCECAL VALVE, POLYPECTOMY:  ?- Inflammatory polyp(s)  ?- Other fragments of polypoid colonic mucosa with prominent lymphoid  ?aggregates  ?- Negative for dysplasia  ? ?B. COLON, ASCENDING, POLYPECTOMY:  ?- Sessile serrated polyp(s) without cytologic dysplasia  ? ?C. COLON, DESCENDING, POLYPECTOMY:  ?- Tubulovillous adenoma, 1.6 cm  ?- Negative for high-grade dysplasia or malignancy  ?- Lateral mucosal margins are negative for dysplasia.  ? ?Recommended repeat colonoscopy in 2 years.  It was considered that his rectal bleeding was related to the large descending colon polyp. ? ?Patient reports that after his colonoscopy he had a change in his bowel  movements as he  started having a bowel movement 2 times a week. He used to have a BM 2 times a day. States he used to strain but not anymore. Due to this, he started taking Miralax as needed.  Currently he is having a BM every day and he feels he is getting back to his baseline bowel movement frequency. ? ?He had some abdominal cramping in the past but has been getting better and he does not have any pain anymore. ? ?Notably, he reports that the rectal bleeding resolved. ? ?The patient denies having any nausea, vomiting, fever, chills, melena, hematemesis, abdominal distention, abdominal pain, diarrhea, jaundice, pruritus or weight loss. ? ?Last Colonoscopy: as above ? ?Past Medical History:History reviewed. No pertinent past medical history. ? ?Past Surgical History: ?Past Surgical History:  ?Procedure Laterality Date  ? COLONOSCOPY WITH PROPOFOL N/A 01/24/2021  ? Procedure: COLONOSCOPY WITH PROPOFOL;  Surgeon: Harvel Quale, MD;  [facility];  Service: Gastroenterology;  Laterality: N/A;  9:15  ? HEMOSTASIS CLIP PLACEMENT  01/24/2021  ? Procedure: HEMOSTASIS CLIP PLACEMENT;  Surgeon: Harvel Quale, MD;  Location: [facility];  Service: Gastroenterology;;  ? POLYPECTOMY  01/24/2021  ? Procedure: POLYPECTOMY INTESTINAL;  Surgeon: Harvel Quale, MD;  Location: [facility];  Service: Gastroenterology;;  ? WISDOM TOOTH EXTRACTION    ? ? ?Family History:History reviewed. No pertinent family history. ? ?Social History: ?Social History  ? ?Tobacco Use  ?Smoking Status Never  ?Smokeless Tobacco Never  ? ?Social History  ? ?Substance and Sexual Activity  ?Alcohol Use Yes  ? Alcohol/week: 3.0 standard drinks  ?  Types: 2 Cans of beer, 1 Shots of liquor per week  ? Comment: socially  ? ?Social History  ? ?Substance and Sexual Activity  ?Drug Use No  ? ? ?Allergies: ?Allergies  ?Allergen Reactions  ? Bee Venom Anaphylaxis  ? ? ?Medications: ?Current Outpatient Medications   ?Medication Sig Dispense Refill  ? acetaminophen (TYLENOL) 500 MG tablet Take 1,000 mg by mouth every 8 (eight) hours as needed for headache.    ? busPIRone (BUSPAR) 5 MG tablet Take 5 mg by mouth 2 (two) times daily.    ? EPINEPHrine 0.3 mg/0.3 mL IJ SOAJ injection Inject 0.3 mg into the muscle as needed for anaphylaxis.    ? ibuprofen (ADVIL) 200 MG tablet Take 400 mg by mouth every 8 (eight) hours as needed for headache.    ? lisinopril (ZESTRIL) 20 MG tablet Take 20 mg by mouth daily.    ? omeprazole (PRILOSEC) 40 MG capsule Take 40 mg by mouth daily.    ? dicyclomine (BENTYL) 10 MG capsule Take 1 capsule (10 mg total) by mouth 2 (two) times daily. (Patient not taking: Reported on 07/26/2021) 60 capsule 0  ? ?No current facility-administered medications for this visit.  ? ? ?Review of Systems: ?GENERAL: negative for malaise, night sweats ?HEENT: No changes in hearing or vision, no nose bleeds or other nasal problems. ?NECK: Negative for lumps, goiter, pain and significant neck swelling ?RESPIRATORY: Negative for cough, wheezing ?CARDIOVASCULAR: Negative for chest pain, leg swelling, palpitations, orthopnea ?GI: SEE HPI ?MUSCULOSKELETAL: Negative for joint pain or swelling, back pain, and muscle pain. ?SKIN: Negative for lesions, rash ?PSYCH: Negative for sleep disturbance, mood disorder and recent psychosocial stressors. ?HEMATOLOGY Negative for prolonged bleeding, bruising easily, and swollen nodes. ?ENDOCRINE: Negative for cold or heat intolerance, polyuria, polydipsia and goiter. ?NEURO: negative for tremor, gait imbalance, syncope and seizures. ?The remainder of the review of systems is noncontributory. ? ? ?Physical Exam: ?BP (!) 157/113 (BP Location: Left Arm, Patient Position: Sitting, Cuff Size: Large)   Pulse 90   Temp 98.1 ?F (36.7 ?C) (Oral)   Ht 6' (1.829 m)   Wt 221 lb (100.2 kg)   BMI 29.97 kg/m?  ?GENERAL: The patient is AO x3, in no acute distress. ?HEENT: Head is normocephalic and  atraumatic. EOMI are intact. Mouth is well hydrated and without lesions. ?NECK: Supple. No masses ?LUNGS: Clear to auscultation. No presence of rhonchi/wheezing/rales. Adequate chest expansion ?HEART: RRR, normal s1 and s2. ?ABDOMEN: Soft, nontender, no guarding, no peritoneal signs, and nondistended. BS +. No masses. ?EXTREMITIES: Without any cyanosis, clubbing, rash, lesions or edema. ?NEUROLOGIC: AOx3, no focal motor deficit. ?SKIN: no jaundice, no rashes ? ?Imaging/Labs: ?as above ? ?I personally reviewed and interpreted the available labs, imaging and endoscopic files. ? ?Impression and Plan: ?John Montoya is a 41 y.o. male with no significant past medical history, who presents for evaluation of constipation.  Patient has presented new onset of constipation.  It seems that he is now going back to his baseline bowel movement frequency with the use of as needed MiraLAX.  He can keep doing this, as long as taking high amount of fiber in diet.  We will look up for other potential reversible etiologies of constipation with blood work-up today.  He agreed and understood. ? ?He is due for repeat colonoscopy 2 years from his most recent colonoscopy. ? ?- Eat prunes and/or kiwi daily ?- Increase water intake ?- Can take Miralax as needed ?- Check TSH and CMP ? ?All  questions were answered.     ? ?Harvel Quale, MD ?Gastroenterology and Hepatology ?[facility] ? ?

## 2021-07-27 LAB — COMPREHENSIVE METABOLIC PANEL
AG Ratio: 1.5 (calc) (ref 1.0–2.5)
ALT: 22 U/L (ref 9–46)
AST: 18 U/L (ref 10–40)
Albumin: 4.4 g/dL (ref 3.6–5.1)
Alkaline phosphatase (APISO): 59 U/L (ref 36–130)
BUN: 10 mg/dL (ref 7–25)
CO2: 27 mmol/L (ref 20–32)
Calcium: 9.6 mg/dL (ref 8.6–10.3)
Chloride: 104 mmol/L (ref 98–110)
Creat: 1.28 mg/dL (ref 0.60–1.29)
Globulin: 2.9 g/dL (calc) (ref 1.9–3.7)
Glucose, Bld: 86 mg/dL (ref 65–99)
Potassium: 4.8 mmol/L (ref 3.5–5.3)
Sodium: 140 mmol/L (ref 135–146)
Total Bilirubin: 0.4 mg/dL (ref 0.2–1.2)
Total Protein: 7.3 g/dL (ref 6.1–8.1)

## 2021-07-27 LAB — TSH: TSH: 2.33 mIU/L (ref 0.40–4.50)

## 2022-12-23 ENCOUNTER — Ambulatory Visit (INDEPENDENT_AMBULATORY_CARE_PROVIDER_SITE_OTHER): Payer: 59 | Admitting: Gastroenterology

## 2022-12-23 ENCOUNTER — Encounter (INDEPENDENT_AMBULATORY_CARE_PROVIDER_SITE_OTHER): Payer: Self-pay

## 2022-12-23 ENCOUNTER — Encounter (INDEPENDENT_AMBULATORY_CARE_PROVIDER_SITE_OTHER): Payer: Self-pay | Admitting: Gastroenterology

## 2022-12-23 VITALS — BP 136/94 | HR 97 | Temp 97.8°F | Ht 72.0 in | Wt 225.9 lb

## 2022-12-23 DIAGNOSIS — R194 Change in bowel habit: Secondary | ICD-10-CM | POA: Diagnosis not present

## 2022-12-23 DIAGNOSIS — Z86010 Personal history of colonic polyps: Secondary | ICD-10-CM | POA: Diagnosis not present

## 2022-12-23 MED ORDER — PEG 3350-KCL-NA BICARB-NACL 420 G PO SOLR: 4000.0000 mL | Freq: Once | ORAL | 0 refills | Status: AC

## 2022-12-23 NOTE — H&P (View-Only) (Signed)
Katrinka Blazing, M.D. Gastroenterology & Hepatology [facility] [address] [place], [place] [address]  Primary Care Physician: Lovey Newcomer, [place] [address] [place] [address]  I will communicate my assessment and recommendations to the referring MD via EMR.  Problems: IBS   History of Present Illness: John Montoya is a 42 y.o. male with no significant past medical history, who presents for evaluation of changes in bowel movements.  The patient was last seen on 07/26/2021. At that time, the patient was advised to increase the intake of fiber as a MiraLAX as needed for constipation.  TSH and CMP were checked which were within normal limits.  Patient reports that since his last clinic visit, he has not presented any more constipation. States that he is currently having 3-4 bowel movements per day, which are usually loose in consistency. He reprots that sometimes he has some urgency to have a BM, happens in 10% of the time.No fecal soiling. No nighttime symptoms. He reports that he is not currently taking any fiber or Miralax.  The patient denies having any nausea, vomiting, fever, chills, hematochezia, melena, hematemesis, abdominal distention, abdominal pain, diarrhea, jaundice, pruritus or weight loss.  Colonoscopy performed on 01/24/21 showed - The examined portion of the ileum was normal. - Six 2 to 4 mm polyps in the cecum, removed with a cold snare. Resected and retrieved. - Ten 1 to 2 mm polyps in the cecum and at the ileocecal valve, removed with a cold biopsy forceps. Resected and retrieved. - One 8 mm polyp in the ascending colon, removed with a cold snare. Resected and retrieved. - One 20 mm polyp in the descending colon, removed with a hot snare. Resected and retrieved. Clip was placed. - Diverticulosis in the sigmoid colon and in the descending colon. - Non-bleeding internal hemorrhoids.   Path: A. COLON, CECAL,  ILEOCECAL VALVE, POLYPECTOMY:  - Inflammatory polyp(s)  - Other fragments of polypoid colonic mucosa with prominent lymphoid  aggregates  - Negative for dysplasia   B. COLON, ASCENDING, POLYPECTOMY:  - Sessile serrated polyp(s) without cytologic dysplasia   C. COLON, DESCENDING, POLYPECTOMY:  - Tubulovillous adenoma, 1.6 cm  - Negative for high-grade dysplasia or malignancy  - Lateral mucosal margins are negative for dysplasia.    Recommended repeat colonoscopy in 2 years.  It was considered that his rectal bleeding was related to the large descending colon polyp.  Past Medical History:History reviewed. No pertinent past medical history.  Past Surgical History: Past Surgical History:  Procedure Laterality Date   COLONOSCOPY WITH PROPOFOL N/A 01/24/2021   Procedure: COLONOSCOPY WITH PROPOFOL;  Surgeon: Dolores Frame, MD;  [facility];  Service: Gastroenterology;  Laterality: N/A;  9:15   HEMOSTASIS CLIP PLACEMENT  01/24/2021   Procedure: HEMOSTASIS CLIP PLACEMENT;  Surgeon: Dolores Frame, MD;  [facility];  Service: Gastroenterology;;   POLYPECTOMY  01/24/2021   Procedure: POLYPECTOMY INTESTINAL;  Surgeon: Dolores Frame, MD;  [facility];  Service: Gastroenterology;;   WISDOM TOOTH EXTRACTION      Family History:History reviewed. No pertinent family history.  Social History: Social History   Tobacco Use  Smoking Status Never  Smokeless Tobacco Never   Social History   Substance and Sexual Activity  Alcohol Use Yes   Alcohol/week: 3.0 standard drinks of alcohol   Types: 2 Cans of beer, 1 Shots of liquor per week   Comment: socially   Social History  Substance and Sexual Activity  Drug Use No    Allergies: Allergies  Allergen Reactions   Bee Venom Anaphylaxis    Medications: Current Outpatient Medications  Medication Sig Dispense Refill   amLODipine (NORVASC) 5 MG tablet Take 5 mg by  mouth daily.     busPIRone (BUSPAR) 5 MG tablet Take 5 mg by mouth 2 (two) times daily.     EPINEPHrine 0.3 mg/0.3 mL IJ SOAJ injection Inject 0.3 mg into the muscle as needed for anaphylaxis.     lisinopril (ZESTRIL) 20 MG tablet Take 20 mg by mouth daily.     omeprazole (PRILOSEC) 40 MG capsule Take 40 mg by mouth daily.     No current facility-administered medications for this visit.    Review of Systems: GENERAL: negative for malaise, night sweats HEENT: No changes in hearing or vision, no nose bleeds or other nasal problems. NECK: Negative for lumps, goiter, pain and significant neck swelling RESPIRATORY: Negative for cough, wheezing CARDIOVASCULAR: Negative for chest pain, leg swelling, palpitations, orthopnea GI: SEE HPI MUSCULOSKELETAL: Negative for joint pain or swelling, back pain, and muscle pain. SKIN: Negative for lesions, rash PSYCH: Negative for sleep disturbance, mood disorder and recent psychosocial stressors. HEMATOLOGY Negative for prolonged bleeding, bruising easily, and swollen nodes. ENDOCRINE: Negative for cold or heat intolerance, polyuria, polydipsia and goiter. NEURO: negative for tremor, gait imbalance, syncope and seizures. The remainder of the review of systems is noncontributory.   Physical Exam: BP (!) 136/94 (BP Location: Left Arm, Patient Position: Sitting, Cuff Size: Large)   Pulse 97   Temp 97.8 F (36.6 C) (Temporal)   Ht 6' (1.829 m)   Wt 225 lb 14.4 oz (102.5 kg)   BMI 30.64 kg/m  GENERAL: The patient is AO x3, in no acute distress. HEENT: Head is normocephalic and atraumatic. EOMI are intact. Mouth is well hydrated and without lesions. NECK: Supple. No masses LUNGS: Clear to auscultation. No presence of rhonchi/wheezing/rales. Adequate chest expansion HEART: RRR, normal s1 and s2. ABDOMEN: Soft, nontender, no guarding, no peritoneal signs, and nondistended. BS +. No masses. EXTREMITIES: Without any cyanosis, clubbing, rash, lesions or  edema. NEUROLOGIC: AOx3, no focal motor deficit. SKIN: no jaundice, no rashes  Imaging/Labs: as above  I personally reviewed and interpreted the available labs, imaging and endoscopic files.  Impression and Plan: John Montoya is a 42 y.o. male with no significant past medical history, who presents for evaluation of changes in bowel movements. The patient has presented some changes in his BM frequency without red flag signs. As he had constipation in the past and now has loose bowel movements, it is possible this is related to IBS. I advised him to increase the intake of Benefiber. If he persists with changes in his Bms we will check serology for celiac disease. He is due for repeat colonoscopy given history of polyps, will schedule colonoscopy today - may consider doing biopsies if still presenting multiple Bms per day.  -Schedule colonoscopy -Start Benefiber fiber supplements daily to increase stool bulk  All questions were answered.      Katrinka Blazing, MD Gastroenterology and Hepatology [facility]

## 2022-12-23 NOTE — Progress Notes (Signed)
Katrinka Blazing, M.D. Gastroenterology & Hepatology [facility] [address] [place], [place] [address]  Primary Care Physician: Lovey Newcomer, [place] [address] [place] [address]  I will communicate my assessment and recommendations to the referring MD via EMR.  Problems: IBS   History of Present Illness: John Montoya is a 42 y.o. male with no significant past medical history, who presents for evaluation of changes in bowel movements.  The patient was last seen on 07/26/2021. At that time, the patient was advised to increase the intake of fiber as a MiraLAX as needed for constipation.  TSH and CMP were checked which were within normal limits.  Patient reports that since his last clinic visit, he has not presented any more constipation. States that he is currently having 3-4 bowel movements per day, which are usually loose in consistency. He reprots that sometimes he has some urgency to have a BM, happens in 10% of the time.No fecal soiling. No nighttime symptoms. He reports that he is not currently taking any fiber or Miralax.  The patient denies having any nausea, vomiting, fever, chills, hematochezia, melena, hematemesis, abdominal distention, abdominal pain, diarrhea, jaundice, pruritus or weight loss.  Colonoscopy performed on 01/24/21 showed - The examined portion of the ileum was normal. - Six 2 to 4 mm polyps in the cecum, removed with a cold snare. Resected and retrieved. - Ten 1 to 2 mm polyps in the cecum and at the ileocecal valve, removed with a cold biopsy forceps. Resected and retrieved. - One 8 mm polyp in the ascending colon, removed with a cold snare. Resected and retrieved. - One 20 mm polyp in the descending colon, removed with a hot snare. Resected and retrieved. Clip was placed. - Diverticulosis in the sigmoid colon and in the descending colon. - Non-bleeding internal hemorrhoids.   Path: A. COLON, CECAL,  ILEOCECAL VALVE, POLYPECTOMY:  - Inflammatory polyp(s)  - Other fragments of polypoid colonic mucosa with prominent lymphoid  aggregates  - Negative for dysplasia   B. COLON, ASCENDING, POLYPECTOMY:  - Sessile serrated polyp(s) without cytologic dysplasia   C. COLON, DESCENDING, POLYPECTOMY:  - Tubulovillous adenoma, 1.6 cm  - Negative for high-grade dysplasia or malignancy  - Lateral mucosal margins are negative for dysplasia.    Recommended repeat colonoscopy in 2 years.  It was considered that his rectal bleeding was related to the large descending colon polyp.  Past Medical History:History reviewed. No pertinent past medical history.  Past Surgical History: Past Surgical History:  Procedure Laterality Date   COLONOSCOPY WITH PROPOFOL N/A 01/24/2021   Procedure: COLONOSCOPY WITH PROPOFOL;  Surgeon: Dolores Frame, MD;  [facility];  Service: Gastroenterology;  Laterality: N/A;  9:15   HEMOSTASIS CLIP PLACEMENT  01/24/2021   Procedure: HEMOSTASIS CLIP PLACEMENT;  Surgeon: Dolores Frame, MD;  [facility];  Service: Gastroenterology;;   POLYPECTOMY  01/24/2021   Procedure: POLYPECTOMY INTESTINAL;  Surgeon: Dolores Frame, MD;  [facility];  Service: Gastroenterology;;   WISDOM TOOTH EXTRACTION      Family History:History reviewed. No pertinent family history.  Social History: Social History   Tobacco Use  Smoking Status Never  Smokeless Tobacco Never   Social History   Substance and Sexual Activity  Alcohol Use Yes   Alcohol/week: 3.0 standard drinks of alcohol   Types: 2 Cans of beer, 1 Shots of liquor per week   Comment: socially   Social History  Substance and Sexual Activity  Drug Use No    Allergies: Allergies  Allergen Reactions   Bee Venom Anaphylaxis    Medications: Current Outpatient Medications  Medication Sig Dispense Refill   amLODipine (NORVASC) 5 MG tablet Take 5 mg by  mouth daily.     busPIRone (BUSPAR) 5 MG tablet Take 5 mg by mouth 2 (two) times daily.     EPINEPHrine 0.3 mg/0.3 mL IJ SOAJ injection Inject 0.3 mg into the muscle as needed for anaphylaxis.     lisinopril (ZESTRIL) 20 MG tablet Take 20 mg by mouth daily.     omeprazole (PRILOSEC) 40 MG capsule Take 40 mg by mouth daily.     No current facility-administered medications for this visit.    Review of Systems: GENERAL: negative for malaise, night sweats HEENT: No changes in hearing or vision, no nose bleeds or other nasal problems. NECK: Negative for lumps, goiter, pain and significant neck swelling RESPIRATORY: Negative for cough, wheezing CARDIOVASCULAR: Negative for chest pain, leg swelling, palpitations, orthopnea GI: SEE HPI MUSCULOSKELETAL: Negative for joint pain or swelling, back pain, and muscle pain. SKIN: Negative for lesions, rash PSYCH: Negative for sleep disturbance, mood disorder and recent psychosocial stressors. HEMATOLOGY Negative for prolonged bleeding, bruising easily, and swollen nodes. ENDOCRINE: Negative for cold or heat intolerance, polyuria, polydipsia and goiter. NEURO: negative for tremor, gait imbalance, syncope and seizures. The remainder of the review of systems is noncontributory.   Physical Exam: BP (!) 136/94 (BP Location: Left Arm, Patient Position: Sitting, Cuff Size: Large)   Pulse 97   Temp 97.8 F (36.6 C) (Temporal)   Ht 6' (1.829 m)   Wt 225 lb 14.4 oz (102.5 kg)   BMI 30.64 kg/m  GENERAL: The patient is AO x3, in no acute distress. HEENT: Head is normocephalic and atraumatic. EOMI are intact. Mouth is well hydrated and without lesions. NECK: Supple. No masses LUNGS: Clear to auscultation. No presence of rhonchi/wheezing/rales. Adequate chest expansion HEART: RRR, normal s1 and s2. ABDOMEN: Soft, nontender, no guarding, no peritoneal signs, and nondistended. BS +. No masses. EXTREMITIES: Without any cyanosis, clubbing, rash, lesions or  edema. NEUROLOGIC: AOx3, no focal motor deficit. SKIN: no jaundice, no rashes  Imaging/Labs: as above  I personally reviewed and interpreted the available labs, imaging and endoscopic files.  Impression and Plan: John Montoya is a 42 y.o. male with no significant past medical history, who presents for evaluation of changes in bowel movements. The patient has presented some changes in his BM frequency without red flag signs. As he had constipation in the past and now has loose bowel movements, it is possible this is related to IBS. I advised him to increase the intake of Benefiber. If he persists with changes in his Bms we will check serology for celiac disease. He is due for repeat colonoscopy given history of polyps, will schedule colonoscopy today - may consider doing biopsies if still presenting multiple Bms per day.  -Schedule colonoscopy -Start Benefiber fiber supplements daily to increase stool bulk  All questions were answered.      Katrinka Blazing, MD Gastroenterology and Hepatology [facility]

## 2022-12-23 NOTE — Patient Instructions (Signed)
Schedule colonoscopy Start Benefiber fiber supplements daily to increase stool bulk

## 2023-01-15 ENCOUNTER — Encounter (HOSPITAL_COMMUNITY)
Admission: RE | Disposition: A | Discharge status: Home / Self Care | Payer: Self-pay | Source: Home / Self Care | Attending: Gastroenterology

## 2023-01-15 ENCOUNTER — Encounter (INDEPENDENT_AMBULATORY_CARE_PROVIDER_SITE_OTHER): Payer: Self-pay | Admitting: *Deleted

## 2023-01-15 ENCOUNTER — Ambulatory Visit (HOSPITAL_COMMUNITY): Payer: 59 | Admitting: Certified Registered Nurse Anesthetist

## 2023-01-15 ENCOUNTER — Ambulatory Visit (HOSPITAL_COMMUNITY)
Admission: RE | Admit: 2023-01-15 | Discharge: 2023-01-15 | Disposition: A | Discharge status: Home / Self Care | Payer: 59 | Attending: Gastroenterology | Admitting: Gastroenterology

## 2023-01-15 ENCOUNTER — Other Ambulatory Visit: Payer: Self-pay

## 2023-01-15 ENCOUNTER — Encounter (HOSPITAL_COMMUNITY): Payer: Self-pay | Admitting: Gastroenterology

## 2023-01-15 DIAGNOSIS — K635 Polyp of colon: Secondary | ICD-10-CM | POA: Diagnosis not present

## 2023-01-15 DIAGNOSIS — Z860101 Personal history of adenomatous and serrated colon polyps: Secondary | ICD-10-CM | POA: Insufficient documentation

## 2023-01-15 DIAGNOSIS — K5730 Diverticulosis of large intestine without perforation or abscess without bleeding: Secondary | ICD-10-CM | POA: Diagnosis not present

## 2023-01-15 DIAGNOSIS — Z1211 Encounter for screening for malignant neoplasm of colon: Secondary | ICD-10-CM | POA: Diagnosis present

## 2023-01-15 DIAGNOSIS — D120 Benign neoplasm of cecum: Secondary | ICD-10-CM | POA: Diagnosis not present

## 2023-01-15 DIAGNOSIS — D122 Benign neoplasm of ascending colon: Secondary | ICD-10-CM | POA: Diagnosis not present

## 2023-01-15 DIAGNOSIS — I10 Essential (primary) hypertension: Secondary | ICD-10-CM | POA: Diagnosis not present

## 2023-01-15 DIAGNOSIS — K589 Irritable bowel syndrome without diarrhea: Secondary | ICD-10-CM | POA: Diagnosis not present

## 2023-01-15 DIAGNOSIS — Z860100 Personal history of colon polyps, unspecified: Secondary | ICD-10-CM

## 2023-01-15 DIAGNOSIS — K649 Unspecified hemorrhoids: Secondary | ICD-10-CM

## 2023-01-15 DIAGNOSIS — K648 Other hemorrhoids: Secondary | ICD-10-CM | POA: Diagnosis not present

## 2023-01-15 HISTORY — DX: Essential (primary) hypertension: I10

## 2023-01-15 HISTORY — PX: COLONOSCOPY WITH PROPOFOL: SHX5780

## 2023-01-15 HISTORY — PX: POLYPECTOMY: SHX5525

## 2023-01-15 HISTORY — PX: BIOPSY: SHX5522

## 2023-01-15 SURGERY — COLONOSCOPY WITH PROPOFOL
Anesthesia: General

## 2023-01-15 MED ORDER — PROPOFOL 10 MG/ML IV BOLUS
INTRAVENOUS | Status: DC | PRN
  Administered 2023-01-15: 100 mg via INTRAVENOUS

## 2023-01-15 MED ORDER — LACTATED RINGERS IV SOLN: INTRAVENOUS | Status: DC

## 2023-01-15 MED ORDER — PROPOFOL 500 MG/50ML IV EMUL
INTRAVENOUS | Status: AC
  Filled 2023-01-15: qty 50

## 2023-01-15 MED ORDER — LACTATED RINGERS IV SOLN: INTRAVENOUS | Status: DC | PRN

## 2023-01-15 MED ORDER — PROPOFOL 500 MG/50ML IV EMUL
INTRAVENOUS | Status: DC | PRN
  Administered 2023-01-15: 150 ug/kg/min via INTRAVENOUS

## 2023-01-15 NOTE — Interval H&P Note (Signed)
History and Physical Interval Note:  01/15/2023 8:23 AM  John Montoya  has presented today for surgery, with the diagnosis of history colon polyps.  The various methods of treatment have been discussed with the patient and family. After consideration of risks, benefits and other options for treatment, the patient has consented to  Procedure(s) with comments: COLONOSCOPY WITH PROPOFOL (N/A) - 945am;asa 1 as a surgical intervention.  The patient's history has been reviewed, patient examined, no change in status, stable for surgery.  I have reviewed the patient's chart and labs.  Questions were answered to the patient's satisfaction.     Katrinka Blazing Mayorga

## 2023-01-15 NOTE — Transfer of Care (Signed)
Immediate Anesthesia Transfer of Care Note  Patient: Edgerrin Correia  Procedure(s) Performed: COLONOSCOPY WITH PROPOFOL POLYPECTOMY BIOPSY  Patient Location: Endoscopy Unit  Anesthesia Type:General  Level of Consciousness: drowsy  Airway & Oxygen Therapy: Patient Spontanous Breathing  Post-op Assessment: Report given to RN and Post -op Vital signs reviewed and stable  Post vital signs: Reviewed and stable  Last Vitals:  Vitals Value Taken Time  BP 102/74 01/15/23 1036  Temp 36.6 C 01/15/23 1036  Pulse 84 01/15/23 1036  Resp 12 01/15/23 1036  SpO2 95 % 01/15/23 1036    Last Pain:  Vitals:   01/15/23 1036  TempSrc: Oral  PainSc:          Complications: No notable events documented.

## 2023-01-15 NOTE — Anesthesia Preprocedure Evaluation (Signed)
Anesthesia Evaluation  Patient identified by MRN, date of birth, ID band Patient awake    Reviewed: Allergy & Precautions, H&P , NPO status , Patient's Chart, lab work & pertinent test results, reviewed documented beta blocker date and time   Airway Mallampati: II  TM Distance: >3 FB Neck ROM: full    Dental no notable dental hx.    Pulmonary neg pulmonary ROS   Pulmonary exam normal breath sounds clear to auscultation       Cardiovascular Exercise Tolerance: Good hypertension, negative cardio ROS  Rhythm:regular Rate:Normal     Neuro/Psych negative neurological ROS  negative psych ROS   GI/Hepatic negative GI ROS, Neg liver ROS,,,  Endo/Other  negative endocrine ROS    Renal/GU negative Renal ROS  negative genitourinary   Musculoskeletal   Abdominal   Peds  Hematology negative hematology ROS (+)   Anesthesia Other Findings   Reproductive/Obstetrics negative OB ROS                             Anesthesia Physical Anesthesia Plan  ASA: 2  Anesthesia Plan: General   Post-op Pain Management:    Induction:   PONV Risk Score and Plan: Propofol infusion  Airway Management Planned:   Additional Equipment:   Intra-op Plan:   Post-operative Plan:   Informed Consent: I have reviewed the patients History and Physical, chart, labs and discussed the procedure including the risks, benefits and alternatives for the proposed anesthesia with the patient or authorized representative who has indicated his/her understanding and acceptance.     Dental Advisory Given  Plan Discussed with: CRNA  Anesthesia Plan Comments:        Anesthesia Quick Evaluation  

## 2023-01-15 NOTE — Discharge Instructions (Signed)
You are being discharged to home.  Resume your previous diet.  We are waiting for your pathology results.  Your physician has recommended a repeat colonoscopy in three years for surveillance.  

## 2023-01-15 NOTE — Op Note (Signed)
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 Estimated Blood Loss:     Estimated blood loss: none. Procedure:                Pre-Anesthesia Assessment:                           - Prior to the procedure, a History and Physical                            was performed, and patient medications, allergies                            and sensitivities were reviewed. The patient's                            tolerance of previous anesthesia was reviewed.                           - The risks and benefits of the procedure and the                            sedation options and risks were discussed with the                            patient. All questions were answered and informed  consent was obtained.                           - ASA Grade Assessment: II - A patient with mild                            systemic disease.                           After obtaining informed consent, the colonoscope                            was passed under direct vision. Throughout the                            procedure, the patient's blood pressure, pulse, and                             oxygen saturations were monitored continuously. The                            [id number]-HQ190L ([id number]) scope was introduced through                            the anus and advanced to the the cecum, identified                            by appendiceal orifice and ileocecal valve. The                            colonoscopy was performed without difficulty. The                            patient tolerated the procedure well. The quality                            of the bowel preparation was excellent. Scope In: 10:09:15 AM Scope Out: 10:32:48 AM Scope Withdrawal Time: 0 hours 20 minutes 25 seconds  Total Procedure Duration: 0 hours 23 minutes 33 seconds  Findings:      The perianal and digital rectal examinations were normal.      Four sessile polyps were found in the cecum. The polyps were 1 mm in       size. These polyps were removed with a cold biopsy forceps. Resection       and retrieval were complete.      Nine sessile polyps were found in the ascending colon and cecum. The       polyps were 2 to 5 mm in size. These polyps were removed with a cold       snare. Resection and retrieval were complete.      Scattered small-mouthed diverticula were found in the sigmoid colon and       descending colon.      Non-bleeding internal hemorrhoids were found during retroflexion. The       hemorrhoids were small. Impression:               -  Four 1 mm polyps in the cecum, removed with a                            cold biopsy forceps. Resected and retrieved.                           - Nine 2 to 5 mm polyps in the ascending colon and                            in the cecum, removed with a cold snare. Resected                            and retrieved.                           - Diverticulosis in the sigmoid colon and in the                            descending colon.                           - Non-bleeding internal hemorrhoids. Moderate Sedation:      Per Anesthesia  Care Recommendation:           - Discharge patient to home (ambulatory).                           - Resume previous diet.                           - Await pathology results.                           - Repeat colonoscopy in 3 years for surveillance.                           - May consider referral for genetic counseling                            depending on pathology report. Procedure Code(s):        --- Professional ---                           [phone], Colonoscopy, flexible; with removal of                            tumor(s), polyp(s), or other lesion(s) by snare                            technique                           45380, 59, Colonoscopy, flexible; with biopsy,                            single or multiple Diagnosis Code(s):        ---  Professional ---                           D12.2, Benign neoplasm of ascending colon                           D12.0, Benign neoplasm of cecum                           K64.8, Other hemorrhoids                           Z86.010, Personal history of colonic polyps                           K57.30, Diverticulosis of large intestine without                            perforation or abscess without bleeding CPT copyright 2022 American Medical Association. All rights reserved. The codes documented in this report are preliminary and upon coder review may  be revised to meet current compliance requirements. Katrinka Blazing, MD Katrinka Blazing,  01/15/2023 10:43:22 AM This report has been signed electronically. Number of Addenda: 0

## 2023-01-16 LAB — SURGICAL PATHOLOGY

## 2023-01-17 ENCOUNTER — Encounter (INDEPENDENT_AMBULATORY_CARE_PROVIDER_SITE_OTHER): Payer: Self-pay | Admitting: *Deleted

## 2023-01-17 NOTE — Anesthesia Postprocedure Evaluation (Signed)
Anesthesia Post Note  Patient: John Montoya  Procedure(s) Performed: COLONOSCOPY WITH PROPOFOL POLYPECTOMY BIOPSY  Patient location during evaluation: Phase II Anesthesia Type: General Level of consciousness: awake Pain management: pain level controlled Vital Signs Assessment: post-procedure vital signs reviewed and stable Respiratory status: spontaneous breathing and respiratory function stable Cardiovascular status: blood pressure returned to baseline and stable Postop Assessment: no headache and no apparent nausea or vomiting Anesthetic complications: no Comments: Late entry   No notable events documented.   Last Vitals:  Vitals:   01/15/23 0837 01/15/23 1036  BP: (!) 139/96 102/74  Pulse: (!) 101 84  Resp: 18 12  Temp: 36.8 C 36.6 C  SpO2: 98% 95%    Last Pain:  Vitals:   01/15/23 1038  TempSrc:   PainSc: 0-No pain                 Windell Norfolk

## 2023-01-23 ENCOUNTER — Encounter (HOSPITAL_COMMUNITY): Payer: Self-pay | Admitting: Gastroenterology

## 2023-03-22 ENCOUNTER — Encounter (INDEPENDENT_AMBULATORY_CARE_PROVIDER_SITE_OTHER): Payer: Self-pay | Admitting: Gastroenterology

## 2023-04-09 ENCOUNTER — Encounter (INDEPENDENT_AMBULATORY_CARE_PROVIDER_SITE_OTHER): Payer: Self-pay | Admitting: Gastroenterology

## 2023-04-10 ENCOUNTER — Telehealth (INDEPENDENT_AMBULATORY_CARE_PROVIDER_SITE_OTHER): Payer: Self-pay | Admitting: *Deleted

## 2023-04-10 ENCOUNTER — Telehealth: Payer: Self-pay | Admitting: Genetic Counselor

## 2023-04-10 NOTE — Telephone Encounter (Signed)
 Referral sent, they will contact patient with apt

## 2023-04-10 NOTE — Telephone Encounter (Signed)
 Scheduled patient per scheduling message. Patient is aware of made appointments.

## 2023-04-10 NOTE — Telephone Encounter (Signed)
-----   Message from Toribio Fortune Mayorga sent at 04/09/2023  4:12 PM EST ----- Regarding: referral Hi Jenascia Bumpass, can you please refer the patient to genetic counseling? Dx: colonic polyposis  Thanks,   Toribio Fortune, MD Gastroenterology and Hepatology [facility]

## 2023-04-15 ENCOUNTER — Encounter: Payer: Self-pay | Admitting: Cardiovascular Disease

## 2023-04-15 ENCOUNTER — Ambulatory Visit: Payer: 59 | Attending: Cardiovascular Disease | Admitting: Cardiovascular Disease

## 2023-04-15 VITALS — BP 120/90 | HR 79 | Ht 72.0 in | Wt 229.0 lb

## 2023-04-15 DIAGNOSIS — D473 Essential (hemorrhagic) thrombocythemia: Secondary | ICD-10-CM | POA: Diagnosis not present

## 2023-04-15 DIAGNOSIS — R0789 Other chest pain: Secondary | ICD-10-CM | POA: Insufficient documentation

## 2023-04-15 DIAGNOSIS — Z0130 Encounter for examination of blood pressure without abnormal findings: Secondary | ICD-10-CM

## 2023-04-15 DIAGNOSIS — Z8249 Family history of ischemic heart disease and other diseases of the circulatory system: Secondary | ICD-10-CM | POA: Insufficient documentation

## 2023-04-15 NOTE — Assessment & Plan Note (Signed)
 Patient complains of atypical chest pain dating back approxi-5 years.  He is in a high stress job.  He had a negative stress test in 2020.  The pain is fairly infrequent but when it comes on randomly and it last for 5 to 10 seconds at a time.  He is fairly active and exercises in the gym 5 days a week and walks on the treadmill for 30 minutes at a time without symptoms.  I am going to get a coronary calcium score to her stratify.

## 2023-04-15 NOTE — Progress Notes (Signed)
 04/15/2023 John Montoya   Sep 13, 1980  [id number]  Primary Physician Montoya Elsie RAMAN, PA Primary Cardiologist: Dorn JINNY Lesches MD GENI CODY [place], [place]  HPI:  John Montoya is a 43 y.o. mildly overweight married Caucasian male father of 3 children who works as a soil scientist for [employer].  He was referred by his primary care provider, John Jolee, PA-C for atypical chest pain and risk factors.  He does have a history of treated hypertension.  His father had a heart attack at age 30.  He is never had a heart attack or stroke.  He is fairly active and works out in the gym 5 days a week lifting weights and running on the treadmill for 30 minutes at a time without symptoms.  He had a normal stress test back in 2020.  He relates chest pain that began approxi-5 years ago occurring randomly and fairly infrequently lasting 5 to 10 seconds at a time.   Current Meds  Medication Sig   amLODipine (NORVASC) 5 MG tablet Take 5 mg by mouth daily.   busPIRone  (BUSPAR ) 5 MG tablet Take 5 mg by mouth 2 (two) times daily.   EPINEPHrine 0.3 mg/0.3 mL IJ SOAJ injection Inject 0.3 mg into the muscle as needed for anaphylaxis.   lisinopril  (ZESTRIL ) 20 MG tablet Take 20 mg by mouth daily.   omeprazole (PRILOSEC) 40 MG capsule Take 40 mg by mouth daily.     Allergies  Allergen Reactions   Bee Venom Anaphylaxis    Social History   Socioeconomic History   Marital status: Married    Spouse name: Not on file   Number of children: Not on file   Years of education: Not on file   Highest education level: Not on file  Occupational History   Not on file  Tobacco Use   Smoking status: Never   Smokeless tobacco: Never  Vaping Use   Vaping status: Never Used  Substance and Sexual Activity   Alcohol use: Yes    Alcohol/week: 3.0 standard drinks of alcohol    Types: 2 Cans of beer, 1 Shots of liquor per week    Comment: socially   Drug use: No   Sexual activity: Yes    Partners: Female    Birth  control/protection: None  Other Topics Concern   Not on file  Social History Narrative   Not on file   Social Drivers of Health   Financial Resource Strain: Not on file  Food Insecurity: Not on file  Transportation Needs: Not on file  Physical Activity: Not on file  Stress: Not on file  Social Connections: Not on file  Intimate Partner Violence: Not on file     Review of Systems: General: negative for chills, fever, night sweats or weight changes.  Cardiovascular: negative for chest pain, dyspnea on exertion, edema, orthopnea, palpitations, paroxysmal nocturnal dyspnea or shortness of breath Dermatological: negative for rash Respiratory: negative for cough or wheezing Urologic: negative for hematuria Abdominal: negative for nausea, vomiting, diarrhea, bright red blood per rectum, melena, or hematemesis Neurologic: negative for visual changes, syncope, or dizziness All other systems reviewed and are otherwise negative except as noted above.    Blood pressure (!) 120/90, pulse 79, height 6' (1.829 m), weight 229 lb (103.9 kg), SpO2 99%.  General appearance: alert and no distress Neck: no adenopathy, no carotid bruit, no JVD, supple, symmetrical, trachea midline, and thyroid not enlarged, symmetric, no tenderness/mass/nodules Lungs: clear to auscultation bilaterally Heart:  regular rate and rhythm, S1, S2 normal, no murmur, click, rub or gallop Extremities: extremities normal, atraumatic, no cyanosis or edema Pulses: 2+ and symmetric Skin: Skin color, texture, turgor normal. No rashes or lesions Neurologic: Grossly normal  EKG EKG Interpretation Date/Time:  Tuesday April 15 2023 09:36:02 EST Ventricular Rate:  68 PR Interval:  164 QRS Duration:  92 QT Interval:  378 QTC Calculation: 401 R Axis:   -2  Text Interpretation: Normal sinus rhythm Minimal voltage criteria for LVH, may be normal variant ( R in aVL ) No previous ECGs available Confirmed by Court Carrier  [phone]) on 04/15/2023 10:01:53 AM    ASSESSMENT AND PLAN:   Essential (hemorrhagic) thrombocythemia (HCC) History of essential hypertension her blood pressure measured today at 120/90.  He thinks that his blood pressure changes are related to stress.  He is on amlodipine and lisinopril .  Family history of heart disease Father had his first myocardial infarction at age 71.  Atypical chest pain Patient complains of atypical chest pain dating back approxi-5 years.  He is in a high stress job.  He had a negative stress test in 2020.  The pain is fairly infrequent but when it comes on randomly and it last for 5 to 10 seconds at a time.  He is fairly active and exercises in the gym 5 days a week and walks on the treadmill for 30 minutes at a time without symptoms.  I am going to get a coronary calcium score to her stratify.     Carrier DOROTHA Court MD FACP,FACC,FAHA, [facility] 04/15/2023 10:15 AM

## 2023-04-15 NOTE — Assessment & Plan Note (Signed)
 History of essential hypertension her blood pressure measured today at 120/90.  He thinks that his blood pressure changes are related to stress.  He is on amlodipine and lisinopril.

## 2023-04-15 NOTE — Assessment & Plan Note (Signed)
 Father had his first myocardial infarction at age 44.

## 2023-04-15 NOTE — Patient Instructions (Signed)
Medication Instructions:  Your physician recommends that you continue on your current medications as directed. Please refer to the Current Medication list given to you today.  *If you need a refill on your cardiac medications before your next appointment, please call your pharmacy*   Testing/Procedures: Dr. Allyson Sabal has ordered a CT coronary calcium score.   Test locations:  [facility]  [facility]  This is $99 out of pocket.   Coronary CalciumScan A coronary calcium scan is an imaging test used to look for deposits of calcium and other fatty materials (plaques) in the inner lining of the blood vessels of the heart (coronary arteries). These deposits of calcium and plaques can partly clog and narrow the coronary arteries without producing any symptoms or warning signs. This puts a person at risk for a heart attack. This test can detect these deposits before symptoms develop. Tell a health care provider about: Any allergies you have. All medicines you are taking, including vitamins, herbs, eye drops, creams, and over-the-counter medicines. Any problems you or family members have had with anesthetic medicines. Any blood disorders you have. Any surgeries you have had. Any medical conditions you have. Whether you are pregnant or may be pregnant. What are the risks? Generally, this is a safe procedure. However, problems may occur, including: Harm to a pregnant woman and her unborn baby. This test involves the use of radiation. Radiation exposure can be dangerous to a pregnant woman and her unborn baby. If you are pregnant, you generally should not have this procedure done. Slight increase in the risk of cancer. This is because of the radiation involved in the test. What happens before the procedure? No preparation is needed for this procedure. What happens during the procedure? You will undress and  remove any jewelry around your neck or chest. You will put on a hospital gown. Sticky electrodes will be placed on your chest. The electrodes will be connected to an electrocardiogram (ECG) machine to record a tracing of the electrical activity of your heart. A CT scanner will take pictures of your heart. During this time, you will be asked to lie still and hold your breath for 2-3 seconds while a picture of your heart is being taken. The procedure may vary among health care providers and hospitals. What happens after the procedure? You can get dressed. You can return to your normal activities. It is up to you to get the results of your test. Ask your health care provider, or the department that is doing the test, when your results will be ready. Summary A coronary calcium scan is an imaging test used to look for deposits of calcium and other fatty materials (plaques) in the inner lining of the blood vessels of the heart (coronary arteries). Generally, this is a safe procedure. Tell your health care provider if you are pregnant or may be pregnant. No preparation is needed for this procedure. A CT scanner will take pictures of your heart. You can return to your normal activities after the scan is done. This information is not intended to replace advice given to you by your health care provider. Make sure you discuss any questions you have with your health care provider. Document Released: 09/21/2007 Document Revised: 02/12/2016 Document Reviewed: 02/12/2016 Elsevier Interactive Patient Education  2017 [employer].    Follow-Up: At [facility], you and your health needs are our priority.  As part of our continuing  mission to provide you with exceptional heart care, we have created designated Provider Care Teams.  These Care Teams include your primary Cardiologist (physician) and Advanced Practice Providers (APPs -  Physician Assistants and Nurse Practitioners) who all work together to  provide you with the care you need, when you need it.  We recommend signing up for the patient portal called "MyChart".  Sign up information is provided on this After Visit Summary.  MyChart is used to connect with patients for Virtual Visits (Telemedicine).  Patients are able to view lab/test results, encounter notes, upcoming appointments, etc.  Non-urgent messages can be sent to your provider as well.   To learn more about what you can do with MyChart, go to [email or web address].    Your next appointment:   12 month(s)  Provider:   Nanetta Batty, MD

## 2023-06-03 ENCOUNTER — Ambulatory Visit (HOSPITAL_COMMUNITY)
Admission: RE | Admit: 2023-06-03 | Discharge: 2023-06-03 | Disposition: A | Discharge status: Home / Self Care | Payer: Self-pay | Source: Ambulatory Visit | Attending: Cardiovascular Disease | Admitting: Cardiovascular Disease

## 2023-06-03 DIAGNOSIS — Z8249 Family history of ischemic heart disease and other diseases of the circulatory system: Secondary | ICD-10-CM | POA: Insufficient documentation

## 2023-06-03 DIAGNOSIS — R0789 Other chest pain: Secondary | ICD-10-CM | POA: Insufficient documentation

## 2023-06-17 ENCOUNTER — Inpatient Hospital Stay: Payer: 59

## 2023-06-17 ENCOUNTER — Inpatient Hospital Stay: Payer: 59 | Admitting: Genetic Counselor

## 2023-08-19 NOTE — Progress Notes (Unsigned)
 REFERRING PROVIDER: Urban Garden, MD [phone] [place],  [place] [address]  PRIMARY PROVIDER:  Jolynn Needy, [place]  PRIMARY REASON FOR VISIT:  Encounter Diagnoses  Name Primary?   History of colonic polyps Yes   Family history of colon cancer    Family history of breast cancer      HISTORY OF PRESENT ILLNESS:   John Montoya, a 43 y.o. male, was seen for a [facility] cancer genetics consultation at the request of Dr. Umberto Ganong due to a personal history of colon polyps.  John Montoya presents to clinic today to discuss the possibility of a hereditary predisposition to cancer, to discuss genetic testing, and to further clarify his future cancer risks, as well as potential cancer risks for family members.   John Montoya reports his first colonoscopy was in childhood (age 50 or 19) due to rectal bleeding.  He recalls a second colonoscopy around age 34 as well as two colonoscopies in his 66s.  He remembers having ~5 polyps detected in the colonoscopies in his 68s.   In his late 42s, he said he was recommended to have additional colonoscopies due to his polyp history but did not follow up until his early 62s when he started to experience rectal bleeding.  Records were available for the following colonoscopies:  October 2022 colonoscopy (age 37) showed one 1.6cm tubulovillous adenoma, one sessile serrated polyp, with ~15 other polyps as inflammatory of benign lymphoid follicles.  October 2024 colonoscopy (age 54) showed 74 tubular adenomas (1-7mm).   Recall colonoscopy planned in three year interval.   CANCER HISTORY:  Oncology History   No history exists.   SCREENING/RISK FACTORS:  Colonoscopy: see history noted above PSA screening: no Dermatology screening: previously.    Past Medical History:  Diagnosis Date   Hypertension     Past Surgical History:  Procedure Laterality Date   BIOPSY  01/15/2023   Procedure: BIOPSY;  Surgeon: Urban Garden, MD;  [facility];  Service: Gastroenterology;;   COLONOSCOPY WITH PROPOFOL  N/A 01/24/2021   Procedure: COLONOSCOPY WITH PROPOFOL ;  Surgeon: Urban Garden, MD;  [facility];  Service: Gastroenterology;  Laterality: N/A;  9:15   COLONOSCOPY WITH PROPOFOL  N/A 01/15/2023   Procedure: COLONOSCOPY WITH PROPOFOL ;  Surgeon: Urban Garden, MD;  [facility];  Service: Gastroenterology;  Laterality: N/A;  945am;asa 1   HEMOSTASIS CLIP PLACEMENT  01/24/2021   Procedure: HEMOSTASIS CLIP PLACEMENT;  Surgeon: Urban Garden, MD;  [facility];  Service: Gastroenterology;;   POLYPECTOMY  01/24/2021   Procedure: POLYPECTOMY INTESTINAL;  Surgeon: Urban Garden, MD;  [facility];  Service: Gastroenterology;;   POLYPECTOMY  01/15/2023   Procedure: POLYPECTOMY;  Surgeon: Urban Garden, MD;  [facility];  Service: Gastroenterology;;   WISDOM TOOTH EXTRACTION      FAMILY HISTORY:  We obtained a detailed, 4-generation family history.  Significant diagnoses are listed below: Family History  Problem Relation Age of Onset   Polycythemia Brother 41   Breast cancer Maternal Aunt        dx <50   Liver cancer Maternal Aunt        dx <50   Lung cancer Maternal Aunt    Colon cancer Maternal Uncle        dx <50   Other Paternal Uncle        non-cancerous brain tumors; dx <30   Breast cancer  Maternal Grandmother        mets; dx 31s   Colon cancer Maternal Great-grandfather        dx 80s?; MGF's father     Mr. Mathes is unaware of previous family history of genetic testing for hereditary cancer risks.  Other relatives are unavailable for genetic testing at this time.   There is no reported Ashkenazi Jewish ancestry. There is no known consanguinity.  GENETIC COUNSELING ASSESSMENT: John Montoya is a 43 y.o. 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 . We, therefore, discussed and recommended the following at today's visit.   DISCUSSION: We discussed that polyps in general are common, however, most people have fewer than 5 lifetime polyps.  When an individual has 10 or more polyps we become concerned about an underlying polyposis syndrome.  The most common hereditary polyposis syndromes are Familial Adenomatous Polyposis (FAP), caused by mutations in the APC gene, and MUTYH-Associated Polyposis (MAP), caused by biallelic mutations in the MUTYH gene.  There are other genes that are associated with polyposis. We discussed that testing is beneficial for several reasons, including knowing about cancer risks, identifying potential screening and risk-reduction options that may be appropriate, and to understand if other family members could be at risk for colon polyps and/or cancer and allow them to undergo genetic testing.   We reviewed the characteristics, features and inheritance patterns of hereditary cancer syndromes. We also discussed genetic testing, including the appropriate family members to test, the process of testing, insurance coverage and turn-around-time for results. We discussed the implications of a negative, positive and/or variant of uncertain significant result. We recommended John Montoya pursue genetic testing for genes associated with colon polyps/cancer, breast cancer, and other cancers   John Montoya  was offered a common hereditary cancer panel (~40 genes) and an expanded pan-cancer panel (~70 genes). John Montoya was informed of the benefits and limitations of each panel, including that expanded pan-cancer panels contain genes that do not have clear management guidelines at this point in time.  We also discussed that as the number of genes included on a panel increases, the chances of variants of uncertain significance increases.  After considering  the benefits and limitations of each gene panel, John Montoya  elected to have an expanded pan-cancer panel through [facility] [employer]  The [facility] CustomNext-Cancer +RNAinsight Panel (CancerNext-Expanded +RNF43) includes sequencing, rearrangement, and RNA analysis for the following 78 genes: AIP, ALK, APC, ATM, AXIN2, BAP1, BARD1, BMPR1A, BRCA1, BRCA2, BRIP1, CDC73, CDH1, CDK4, CDKN1B, CDKN2A, CEBPA, CHEK2, CTNNA1, DDX41, DICER1, ETV6, FH, FLCN, GATA2, LZTR1, MAX, MBD4, MEN1, MET, MLH1, MSH2, MSH3, MSH6, MUTYH, NF1, NF2, NTHL1, PALB2, PHOX2B, PMS2, POT1, PRKAR1A, PTCH1, PTEN, RSP20, RNF43, RAD51C, RAD51D, RB1, RET, RUNX1, SDHA, SDHAF2, SDHB, SDHC, SDHD, SMAD4, SMARCA4, SMARCB1, SMARCE1, STK11, SUFU, TMEM127, TP53, TSC1, TSC2, VHL, and WT1 (sequencing and deletion/duplication); EGFR, HOXB13, KIT, MITF, PDGFRA, POLD1, and POLE (sequencing only); EPCAM and GREM1 (deletion/duplication only).   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 He is the most informative relatives available for genetic testing.  We discussed that if his out of pocket cost for testing is over $100, the laboratory should contact him and discuss the self-pay prices and/or patient pay assistance programs.    We discussed the Genetic Information Non-Discrimination Act (GINA) of 2008, which helps protect individuals  against genetic discrimination based on their genetic test results.  It impacts both health insurance and employment.  With health insurance, it protects against genetic test results being used for increased premiums or policy termination. For employment, it protects against hiring, firing and promoting decisions based on genetic test results.  GINA does not apply to those in the [employer], those who work for companies with less than 15 employees, and new life insurance or long-term disability insurance policies.  Health status due to a cancer  diagnosis is not protected under GINA.  PLAN: After considering the risks, benefits, and limitations, John Montoya provided informed consent to pursue genetic testing and the blood sample was sent to [facility] for analysis of the CustomNext-Cancer +RNAinsight Panel. Results should be available within approximately 3 weeks, at which point they will be disclosed by telephone to John Montoya, as will any additional recommendations warranted by these results. John Montoya will receive a summary of his genetic counseling visit and a copy of his results once available. This information will also be available in Epic.   John Montoya questions were answered to his satisfaction today. Our contact information was provided should additional questions or concerns arise. Thank you for the referral and allowing us  to share in the care of your patient.   Achaia Garlock M. Ora Billing, MS, [facility] Genetic Counselor [email or web address] (P) [phone]   40 minutes were spent on the date of the encounter in service to the patient including preparation, face-to-face consultation, documentation and care coordination.  The patient was seen alone.  Drs. Iruku, Gudena and/or Maryalice Smaller were available to discuss this case as needed.    _______________________________________________________________________ For Office Staff:  Number of people involved in session: 1 Was an Intern/ student involved with case: no

## 2023-08-20 ENCOUNTER — Inpatient Hospital Stay

## 2023-08-20 ENCOUNTER — Encounter: Payer: Self-pay | Admitting: Genetic Counselor

## 2023-08-20 ENCOUNTER — Inpatient Hospital Stay: Attending: Genetic Counselor | Admitting: Genetic Counselor

## 2023-08-20 DIAGNOSIS — Z800 Family history of malignant neoplasm of digestive organs: Secondary | ICD-10-CM | POA: Diagnosis not present

## 2023-08-20 DIAGNOSIS — Z860100 Personal history of colon polyps, unspecified: Secondary | ICD-10-CM | POA: Diagnosis not present

## 2023-08-20 DIAGNOSIS — Z803 Family history of malignant neoplasm of breast: Secondary | ICD-10-CM | POA: Diagnosis not present

## 2023-08-20 LAB — GENETIC SCREENING ORDER

## 2023-09-16 ENCOUNTER — Ambulatory Visit: Payer: Self-pay | Admitting: Genetic Counselor

## 2023-09-16 ENCOUNTER — Encounter: Payer: Self-pay | Admitting: Genetic Counselor

## 2023-09-16 DIAGNOSIS — Z860100 Personal history of colon polyps, unspecified: Secondary | ICD-10-CM

## 2023-09-16 DIAGNOSIS — Z803 Family history of malignant neoplasm of breast: Secondary | ICD-10-CM

## 2023-09-16 DIAGNOSIS — Z1379 Encounter for other screening for genetic and chromosomal anomalies: Secondary | ICD-10-CM | POA: Insufficient documentation

## 2023-09-16 DIAGNOSIS — Z800 Family history of malignant neoplasm of digestive organs: Secondary | ICD-10-CM

## 2023-09-16 NOTE — Progress Notes (Signed)
 HPI:   John Montoya was previously seen in the [facility] due to a personal history of colon polyps, a family history of breast, colon, and other cancers, and concerns regarding a hereditary predisposition to cancer.    John Montoya recent genetic test results were disclosed to him by telephone. These results and recommendations are discussed in more detail below.  CANCER/POLYP HISTORY:  John Montoya reports his first colonoscopy was in childhood (age 64 or 26) due to rectal bleeding.  He recalls a second colonoscopy around age 28 as well as two colonoscopies in his 65s.  He remembers having ~5 polyps detected in the colonoscopies in his 88s.   In his late 3s, he said he was recommended to have additional colonoscopies due to his polyp history but did not follow up until his early 27s when he started to experience rectal bleeding.  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  October 2024 colonoscopy (age 81) showed 45 tubular adenomas (1-38mm).    FAMILY HISTORY:  We obtained a detailed, 4-generation family history.  Significant diagnoses are listed below: Family History  Problem Relation Age of Onset   Polycythemia Brother 63   Breast cancer Maternal Aunt        dx <50   Liver cancer Maternal Aunt        dx <50   Lung cancer Maternal Aunt    Colon cancer Maternal Uncle        dx <50   Other Paternal Uncle        non-cancerous brain tumors; dx <30   Breast cancer Maternal Grandmother        mets; dx 26s   Colon cancer Maternal Great-grandfather        dx 12s?; MGF's father     John Montoya is unaware of previous family history of genetic testing for hereditary cancer risks.  Other relatives are unavailable for genetic testing at this time.    There is no reported Ashkenazi Jewish  ancestry. There is no known consanguinity.  GENETIC TEST RESULTS:  The [facility] CustomNext-Cancer +RNAinsight Panel found no pathogenic mutations.   The [facility] CustomNext-Cancer +RNAinsight Panel (CancerNext-Expanded + prelimin colon cancer/polyposis genes) includes sequencing, deletion/duplication, and RNA analysis for the following 79 genes: AIP, ALK, APC, ATM, BAP1, BARD1, BMPR1A, BRCA1, BRCA2, BRIP1, CDC73, CDH1, CDK4, CDKN1B, CDKN2A, CEBPA, CHEK2, DICER1, ETV6, FH, FLCN, GATA2, LZTR1, MAX, MEN1, MET, MLH1, MSH2, MSH6, MUTYH, NF1, NF2, NTHL1, PALB2, PHOX2B, PMS2, POT1, PRKAR1A, PTCH1, PTEN, RAD51C, RAD51D, RB1, RET, RPS20, RUNX1, SDHA, SDHAF2, SDHB, SDHC, SDHD, SMAD4, SMARCA4, SMARCB1, SMARCE1, STK11, SUFU, TMEM127, TP53, TSC1, TSC2, VHL and WT1 (sequencing and deletion/duplication); AXIN2, CTNNA1, DDX41, EGFR, HOXB13, KIT, MBD4, MITF, MLH3, MSH3, PDGFRA, POLD1, POLE and RNF43 (sequencing only); EPCAM and GREM1 (deletion/duplication only). RNA data is routinely analyzed for use in variant interpretation for all genes.  The test report has been scanned into EPIC and is located under the Molecular Pathology section of the Results Review tab.  A portion of the result report is included below for reference. Genetic testing reported out on September 16, 2023.      Even though a pathogenic variant was not identified, possible explanations for the cancer/polyp in the family may include: There may be no hereditary risk for cancer in the family. The polyps/cancer in John Montoya and/or his family  may be sporadic/familial or due to other genetic and environmental factors.  Most cancer is not hereditary.  There may be a gene mutation in one of these genes that current testing methods cannot detect but that chance is small. There could be another gene that has not yet been discovered, or that we have not yet tested, that is responsible for the cancer diagnoses in the family.  It is also possible there is a hereditary  cause for the cancer in the family that John Montoya did not inherit.   Therefore, it is important to remain in touch with cancer genetics in the future so that we can continue to offer John Montoya the most up to date genetic testing.   ADDITIONAL GENETIC TESTING:   John Montoya genetic testing was fairly extensive.  If there are additional relevant genes identified to increase cancer risk that can be analyzed in the future, we would be happy to discuss and coordinate this testing at that time.     CANCER SCREENING RECOMMENDATIONS:  John Montoya test result is considered negative (normal).  This means that we have not identified a hereditary cause for his personal history of colon polyps at this time.   An individual's cancer risk and medical management are not determined by genetic test results alone. Overall cancer risk assessment incorporates additional factors, including personal medical history, family history, and any available genetic information that may result in a personalized plan for cancer prevention and surveillance. Therefore, it is recommended he continue to follow the cancer management and screening guidelines provided by his gastroenterology and primary healthcare provider.   RECOMMENDATIONS FOR FAMILY MEMBERS:   Since he did not inherit a identifiable mutation in a cancer predisposition gene included on this panel, his children could not have inherited a known mutation from him in one of these genes. Individuals in this family might be at some increased risk of developing cancer, over the general population risk, due to the family history of cancer.  Individuals in the family should notify their providers of the family history of cancer and colon polyps.  Given John Montoya history of a tubulovillous adenoma, his first degree relatives (children, siblings, parents) should have colonoscopies no later than age 30 and repeat at least every 5-10 years, or as recommended by their GI  providers.  Given John Montoya needed colonoscopies before age 21 (unknown if polyps were detected at that time), it may be reasonable to consider colonoscopies at younger ages for his children.   First degree relatives of those with colon cancer should receive colonoscopies beginning at age 67, or 10 years prior to the earliest diagnosis of colon cancer in the family, and receive colonoscopies at least every 5 years, or as recommended by their gastroenterologist.   We recommend women in this family have a yearly mammogram beginning at age 56, or 44 years younger than the earliest onset of cancer, an annual clinical breast exam, and perform monthly breast self-exams.  Risk models that take into account family history and hormonal history may be helpful in determining appropriate breast cancer screening options for family members. Male relatives should speak with their providers about prostate cancer screening.  Other members of the family may still carry a pathogenic variant in one of these genes that John Montoya did not inherit. Based on the family history, we recommend his maternal uncle, who was diagnosed with colon cancer before age 79, as well as his maternal aunt, who was diagnosed with breast cancer before  age 56, have genetic counseling and testing. John Montoya can let us  know if we can be of any assistance in coordinating genetic counseling and/or testing for these family members.    FOLLOW-UP:  Cancer genetics is a rapidly advancing field and it is possible that new genetic tests will be appropriate for him and/or his family members in the future. We encourage John Montoya to remain in contact with cancer genetics, so we can update his personal and family histories and let him know of advances in cancer genetics that may benefit this family.   Our contact number was provided.  They are welcome to call us  at anytime with additional questions or concerns.   Jamilya Sarrazin M. Ora Billing, MS, [facility] Genetic  Counselor [email or web address] (P) [phone]

## 2023-12-23 ENCOUNTER — Ambulatory Visit (INDEPENDENT_AMBULATORY_CARE_PROVIDER_SITE_OTHER): Payer: 59 | Admitting: Gastroenterology

## 2024-01-21 ENCOUNTER — Encounter (INDEPENDENT_AMBULATORY_CARE_PROVIDER_SITE_OTHER): Payer: Self-pay | Admitting: Gastroenterology

## 2024-02-09 ENCOUNTER — Encounter: Payer: Self-pay | Admitting: Cardiovascular Disease

## 2024-02-12 ENCOUNTER — Encounter: Payer: Self-pay | Admitting: Cardiovascular Disease

## 2024-02-24 ENCOUNTER — Ambulatory Visit: Attending: Cardiovascular Disease | Admitting: Cardiovascular Disease

## 2024-02-24 ENCOUNTER — Encounter: Payer: Self-pay | Admitting: Cardiovascular Disease

## 2024-02-24 VITALS — BP 138/100 | HR 71 | Ht 72.0 in | Wt 225.0 lb

## 2024-02-24 DIAGNOSIS — Z8249 Family history of ischemic heart disease and other diseases of the circulatory system: Secondary | ICD-10-CM | POA: Diagnosis not present

## 2024-02-24 DIAGNOSIS — R0789 Other chest pain: Secondary | ICD-10-CM | POA: Diagnosis not present

## 2024-02-24 DIAGNOSIS — R072 Precordial pain: Secondary | ICD-10-CM | POA: Diagnosis not present

## 2024-02-24 DIAGNOSIS — I10 Essential (primary) hypertension: Secondary | ICD-10-CM | POA: Insufficient documentation

## 2024-02-24 MED ORDER — METOPROLOL TARTRATE 50 MG PO TABS: ORAL_TABLET | ORAL | 0 refills | Status: AC

## 2024-02-24 NOTE — Assessment & Plan Note (Signed)
 History of essential hypertension blood pressure measured today at 138/94.  He is on amlodipine and lisinopril .  His blood pressure was rechecked with similar findings.  I am going to have him keep a 3-day blood pressure log and we will have him see a Pharm.D. back in 4 weeks to review and make appropriate adjustments.

## 2024-02-24 NOTE — Assessment & Plan Note (Signed)
 Father had heart attack at age 44.

## 2024-02-24 NOTE — Assessment & Plan Note (Signed)
 Atypical chest pain which has gotten more frequent over the last 6 to 8 weeks.  He does admit to having stress at work.  I did do a coronary calcium score on him 06/03/2023 which was 0.  Because of his risk factors and family history of heart disease I am going to get a coronary CTA to rule out soft plaque/CAD.

## 2024-02-24 NOTE — Progress Notes (Signed)
 02/24/2024 Norleen Montoya   1981/01/25  [id number]  Primary Physician John Elsie RAMAN, PA Primary Cardiologist: Dorn JINNY Lesches MD [facility] [place], [place]  HPI:  John Montoya is a 43 y.o.   mildly overweight married Caucasian male father of 3 children who works as a soil scientist for [employer].  He was referred by his primary care provider, John Jolee, PA-C for atypical chest pain and risk factors.  I last saw him in the office 04/15/2023.  He does have a history of treated hypertension.  His father had a heart attack at age 64.  He is never had a heart attack or stroke.  He is fairly active and works out in the gym 5 days a week lifting weights and running on the treadmill for 30 minutes at a time without symptoms.  He had a normal stress test back in 2020.  He relates chest pain that began approxi-5 years ago occurring randomly and fairly infrequently lasting 5 to 10 seconds at a time.  Since I saw him almost a year ago I did get a Correy calcium score and him 06/03/2023 which was 0.  Over the last 6 to 8 weeks he has noticed increased frequency of substernal chest pain not necessarily occurring with activity.  It has however been associated with nausea suggesting that there might be a gastrointestinal etiology.   Current Meds  Medication Sig   amLODipine (NORVASC) 5 MG tablet Take 5 mg by mouth daily.   busPIRone  (BUSPAR ) 5 MG tablet Take 5 mg by mouth 2 (two) times daily.   EPINEPHrine 0.3 mg/0.3 mL IJ SOAJ injection Inject 0.3 mg into the muscle as needed for anaphylaxis.   lisinopril  (ZESTRIL ) 20 MG tablet Take 20 mg by mouth daily.   omeprazole (PRILOSEC) 40 MG capsule Take 40 mg by mouth daily.     Allergies  Allergen Reactions   Bee Venom Anaphylaxis    Social History   Socioeconomic History   Marital status: Married    Spouse name: Not on file   Number of children: Not on file   Years of education: Not on file   Highest education level: Not on file  Occupational  History   Not on file  Tobacco Use   Smoking status: Never   Smokeless tobacco: Never  Vaping Use   Vaping status: Never Used  Substance and Sexual Activity   Alcohol use: Yes    Alcohol/week: 3.0 standard drinks of alcohol    Types: 2 Cans of beer, 1 Shots of liquor per week    Comment: socially   Drug use: No   Sexual activity: Yes    Partners: Female    Birth control/protection: None  Other Topics Concern   Not on file  Social History Narrative   Not on file   Social Drivers of Health   Financial Resource Strain: Not on file  Food Insecurity: Not on file  Transportation Needs: Not on file  Physical Activity: Not on file  Stress: Not on file  Social Connections: Not on file  Intimate Partner Violence: Not on file     Review of Systems: General: negative for chills, fever, night sweats or weight changes.  Cardiovascular: negative for chest pain, dyspnea on exertion, edema, orthopnea, palpitations, paroxysmal nocturnal dyspnea or shortness of breath Dermatological: negative for rash Respiratory: negative for cough or wheezing Urologic: negative for hematuria Abdominal: negative for nausea, vomiting, diarrhea, bright red blood per rectum, melena, or hematemesis Neurologic:  negative for visual changes, syncope, or dizziness All other systems reviewed and are otherwise negative except as noted above.    Blood pressure (!) 138/100, pulse 71, height 6' (1.829 m), weight 225 lb (102.1 kg), SpO2 96%.  General appearance: alert and no distress Neck: no adenopathy, no carotid bruit, no JVD, supple, symmetrical, trachea midline, and thyroid not enlarged, symmetric, no tenderness/mass/nodules Lungs: clear to auscultation bilaterally Heart: regular rate and rhythm, S1, S2 normal, no murmur, click, rub or gallop Extremities: extremities normal, atraumatic, no cyanosis or edema Pulses: 2+ and symmetric Skin: Skin color, texture, turgor normal. No rashes or lesions Neurologic:  Grossly normal  EKG EKG Interpretation Date/Time:  Tuesday February 24 2024 10:03:06 EST Ventricular Rate:  71 PR Interval:  180 QRS Duration:  86 QT Interval:  378 QTC Calculation: 410 R Axis:   0  Text Interpretation: Normal sinus rhythm Normal ECG When compared with ECG of 15-Apr-2023 09:36, No significant change was found Confirmed by Court Carrier [id number]) on 02/24/2024 10:49:56 AM    ASSESSMENT AND PLAN:   Family history of heart disease Father had heart attack at age 13.  Atypical chest pain Atypical chest pain which has gotten more frequent over the last 6 to 8 weeks.  He does admit to having stress at work.  I did do a coronary calcium score on him 06/03/2023 which was 0.  Because of his risk factors and family history of heart disease I am going to get a coronary CTA to rule out soft plaque/CAD.  Essential hypertension History of essential hypertension blood pressure measured today at 138/94.  He is on amlodipine and lisinopril .  His blood pressure was rechecked with similar findings.  I am going to have him keep a 3-day blood pressure log and we will have him see a Pharm.D. back in 4 weeks to review and make appropriate adjustments.     Carrier DOROTHA Court MD FACP,FACC,FAHA, [facility] 02/24/2024 11:01 AM

## 2024-02-24 NOTE — Patient Instructions (Addendum)
 Medication Instructions:  Your physician recommends that you continue on your current medications as directed. Please refer to the Current Medication list given to you today.  *If you need a refill on your cardiac medications before your next appointment, please call your pharmacy*  Testing/Procedures: See below  Follow-Up: At [facility], you and your health needs are our priority.  As part of our continuing mission to provide you with exceptional heart care, our providers are all part of one team.  This team includes your primary Cardiologist (physician) and Advanced Practice Providers or APPs (Physician Assistants and Nurse Practitioners) who all work together to provide you with the care you need, when you need it.  Your next appointment:   6 month(s)  Provider:   Dorn Lesches, MD    We recommend signing up for the patient portal called MyChart.  Sign up information is provided on this After Visit Summary.  MyChart is used to connect with patients for Virtual Visits (Telemedicine).  Patients are able to view lab/test results, encounter notes, upcoming appointments, etc.  Non-urgent messages can be sent to your provider as well.   To learn more about what you can do with MyChart, go to [email or web address].   Other Instructions Dr. Lesches has requested that you schedule an appointment with one of our clinical pharmacists for a blood pressure check appointment within the next 4-6 weeks.  If you monitor your blood pressure (BP) at home, please bring your BP cuff and your BP readings with you to this appointment  HOW TO TAKE YOUR BLOOD PRESSURE: Rest 5 minutes before taking your blood pressure. Don't smoke or drink caffeinated beverages for at least 30 minutes before. Take your blood pressure before (not after) you eat. Sit comfortably with your back supported and both feet on the floor (don't cross your legs). Elevate your arm to heart level on a table or a desk. Use  the proper sized cuff. It should fit smoothly and snugly around your bare upper arm. There should be enough room to slip a fingertip under the cuff. The bottom edge of the cuff should be 1 inch above the crease of the elbow. Ideally, take 3 measurements at one sitting and record the average.      Your cardiac CT will be scheduled at the below location:    [facility] [address]  [place], [place] [address]    If scheduled at the [facility] and [facility] at [address], please enter the parking lot using the [address] entrance and use the FREE valet service at the patient drop-off area. Enter the building and check-in with registration on the main floor.   Please follow these instructions carefully (unless otherwise directed):  An IV will be required for this test and Nitroglycerin will be given.  Hold all erectile dysfunction medications at least 3 days (72 hrs) prior to test. (Ie viagra, cialis, sildenafil, tadalafil, etc)   On the Night Before the Test: Be sure to Drink plenty of water. Do not consume any caffeinated/decaffeinated beverages or chocolate 12 hours prior to your test. Do not take any antihistamines 12 hours prior to your test.   On the Day of the Test: Drink plenty of water until 1 hour prior to the test. Do not eat any food 1 hour prior to test. You may take your regular medications prior to the test.  Take metoprolol (Lopressor) 50mg  two hours prior to test. If you take Furosemide/Hydrochlorothiazide/Spironolactone/Chlorthalidone, please  HOLD on the morning of the test. Patients who wear a continuous glucose monitor MUST remove the device prior to scanning.       After the Test: Drink plenty of water. After receiving IV contrast, you may experience a mild flushed feeling. This is normal. On occasion, you may experience a mild rash up to 24 hours after the test. This is not dangerous. If this occurs, you can take  Benadryl 25 mg, Zyrtec, Claritin, or Allegra and increase your fluid intake. (Patients taking Tikosyn should avoid Benadryl, and may take Zyrtec, Claritin, or Allegra) If you experience trouble breathing, this can be serious. If it is severe call 911 IMMEDIATELY. If it is mild, please call our office.  We will call to schedule your test 2-4 weeks out understanding that some insurance companies will need an authorization prior to the service being performed.   For more information and frequently asked questions, please visit our website : [email or web address]  For non-scheduling related questions, please contact the cardiac imaging nurse navigator should you have any questions/concerns: Cardiac Imaging Nurse Navigators Direct Office Dial: [phone]   For scheduling needs, including cancellations and rescheduling, please call Brittany, [phone].

## 2024-03-10 ENCOUNTER — Encounter (HOSPITAL_COMMUNITY): Payer: Self-pay

## 2024-03-10 ENCOUNTER — Telehealth (HOSPITAL_COMMUNITY): Payer: Self-pay | Admitting: *Deleted

## 2024-03-10 NOTE — Telephone Encounter (Signed)
 Entered in error

## 2024-03-12 ENCOUNTER — Ambulatory Visit (HOSPITAL_COMMUNITY)
Admission: RE | Admit: 2024-03-12 | Discharge: 2024-03-12 | Attending: Cardiovascular Disease | Admitting: Cardiovascular Disease

## 2024-03-12 ENCOUNTER — Ambulatory Visit: Payer: Self-pay | Admitting: Cardiovascular Disease

## 2024-03-12 DIAGNOSIS — R072 Precordial pain: Secondary | ICD-10-CM | POA: Diagnosis not present

## 2024-03-12 MED ORDER — IOHEXOL 350 MG/ML SOLN
100.0000 mL | Freq: Once | INTRAVENOUS | Status: AC | PRN
  Administered 2024-03-12: 100 mL via INTRAVENOUS

## 2024-03-12 MED ORDER — NITROGLYCERIN 0.4 MG SL SUBL
0.8000 mg | SUBLINGUAL_TABLET | Freq: Once | SUBLINGUAL | Status: AC
  Administered 2024-03-12: 0.8 mg via SUBLINGUAL

## 2024-04-13 NOTE — Progress Notes (Deleted)
 Patient ID: John Montoya                 DOB: 1980/06/05                      MRN: [id number]      HPI: Jarid Sasso is a 44 y.o. male referred by Dr. Court to HTN clinic. PMH is significant for HTN and atypical chest pain.   Patient was last evaluated by Dr. Court in November 2025  Atypical chest pain which has gotten more frequent over the last 6 to 8 weeks. He does admit to having stress at work. I did do a coronary calcium score on him 06/03/2023 which was 0. Because of his risk factors and family history of heart disease I am going to get a coronary CTA to rule out soft plaque/CAD which showed CCS 0. No evid of CAD   History of essential hypertension blood pressure measured today at 138/94.  He is on amlodipine and lisinopril .  His blood pressure was rechecked with similar findings.  I am going to have him keep a 3-day blood pressure log and we will have him see a Pharm.D. back in 4 weeks to review and make appropriate adjustments.    Current HTN meds: amlodipine 5 mg daily and lisinopril  20 mg daily Previously tried: none BP goal: < 130/80 Labs (10/2023): Scr 1.27, K 4.4  Family History:  His father had a heart attack at age 48. He is never had a heart attack or stroke.   Relation Problem Comments  Father   Brother Polycythemia (Age: 37)     Maternal Aunt - Connie Breast cancer dx <50  Liver cancer dx <50    Maternal Aunt Lung cancer     Maternal Uncle Colon cancer dx <50    Paternal Uncle Other non-cancerous brain tumors; dx <30    Maternal Grandmother Breast cancer mets; dx 108s    Maternal Great-grandfather (Deceased)      Social History:  Alcohol: Smoking:   Diet:  Breakfast: Lunch/Dinner: Snacks: Beverages:  Exercise:  {types:28256}  Home BP readings:  Date SBP/DBP  HR              Average      Wt Readings from Last 3 Encounters:  02/24/24 225 lb (102.1 kg)  04/15/23 229 lb (103.9 kg)  12/23/22 225 lb 14.4 oz (102.5 kg)   BP Readings from Last 3  Encounters:  03/12/24 (!) 145/83  02/24/24 (!) 138/100  04/15/23 (!) 120/90   Pulse Readings from Last 3 Encounters:  03/12/24 65  02/24/24 71  04/15/23 79    Renal function: CrCl cannot be calculated (Patient's most recent lab result is older than the maximum 21 days allowed.).  Past Medical History:  Diagnosis Date   Hypertension     Medications Ordered Prior to Encounter[1]  Allergies[2]  There were no vitals taken for this visit.   Assessment/Plan:  1. Hypertension -  No problem-specific Assessment & Plan notes found for this encounter.      Thank you  Jimmy Stipes E. Marsean Elkhatib, Pharm.D [place] [facility] [address], [place], [place] [address] Phone: [phone]; Fax: [phone]      [1]  Current Outpatient Medications on File Prior to Visit  Medication Sig Dispense Refill   amLODipine (NORVASC) 5 MG tablet Take 5 mg by mouth daily.     busPIRone  (BUSPAR ) 5 MG tablet  Take 5 mg by mouth 2 (two) times daily.     EPINEPHrine 0.3 mg/0.3 mL IJ SOAJ injection Inject 0.3 mg into the muscle as needed for anaphylaxis.     lisinopril  (ZESTRIL ) 20 MG tablet Take 20 mg by mouth daily.     metoprolol  tartrate (LOPRESSOR ) 50 MG tablet Take 50 mg (1 tablet) TWO hours prior to CT scan 1 tablet 0   omeprazole (PRILOSEC) 40 MG capsule Take 40 mg by mouth daily.     No current facility-administered medications on file prior to visit.  [2]  Allergies Allergen Reactions   Bee Venom Anaphylaxis

## 2024-04-14 ENCOUNTER — Ambulatory Visit

## 2024-04-19 ENCOUNTER — Ambulatory Visit: Attending: Emergency Medicine

## 2024-04-19 VITALS — BP 129/88 | HR 85

## 2024-04-19 DIAGNOSIS — I10 Essential (primary) hypertension: Secondary | ICD-10-CM

## 2024-04-19 MED ORDER — AMLODIPINE BESYLATE 10 MG PO TABS: 10.0000 mg | ORAL_TABLET | Freq: Every day | ORAL | 2 refills | Status: AC

## 2024-04-19 NOTE — Patient Instructions (Addendum)
 Changes made by your pharmacist Floye Fesler E. Patricie Geeslin, PharmD, CPP at today's visit:    Instructions/Changes  (what do you need to do) Your Notes  (what you did and when you did it)  Begin amlodipine  10 mg daily    2.   Continue lisinopril  20 mg daily    3. Check blood pressure daily and record in      log    4. PharmD appointment 05/26/2024   5. Bring monitor to next visit    Bring all of your meds, your BP cuff and your record of home blood pressures to your next appointment.   Lamar Naef E. Audriana Aldama, Pharm.D, CPP [facility]. [facility] [address], [place], [place] [address] Phone: [phone]; Fax: [phone]     HOW TO TAKE YOUR BLOOD PRESSURE AT HOME  Rest 5 minutes before taking your blood pressure.  Dont smoke or drink caffeinated beverages for at least 30 minutes before. Take your blood pressure before (not after) you eat. Sit comfortably with your back supported and both feet on the floor (dont cross your legs). Elevate your arm to heart level on a table or a desk. Use the proper sized cuff. It should fit smoothly and snugly around your bare upper arm. There should be enough room to slip a fingertip under the cuff. The bottom edge of the cuff should be 1 inch above the crease of the elbow. Ideally, take 3 measurements at one sitting and record the average.  Important lifestyle changes to control high blood pressure  Intervention  Effect on the BP  Lose extra pounds and watch your waistline Weight loss is one of the most effective lifestyle changes for controlling blood pressure. If you're overweight or obese, losing even a small amount of weight can help reduce blood pressure. Blood pressure might go down by about 1 millimeter of mercury (mm Hg) with each kilogram (about 2.2 pounds) of weight lost.  Exercise regularly As a general goal, aim for at least 30 minutes of moderate physical activity every day. Regular physical activity  can lower high blood pressure by about 5 to 8 mm Hg.  Eat a healthy diet Eating a diet rich in whole grains, fruits, vegetables, and low-fat dairy products and low in saturated fat and cholesterol. A healthy diet can lower high blood pressure by up to 11 mm Hg.  Reduce salt (sodium) in your diet Even a small reduction of sodium in the diet can improve heart health and reduce high blood pressure by about 5 to 6 mm Hg.  Limit alcohol One drink equals 12 ounces of beer, 5 ounces of wine, or 1.5 ounces of 80-proof liquor.  Limiting alcohol to less than one drink a day for women or two drinks a day for men can help lower blood pressure by about 4 mm Hg.   If you have any questions or concerns please use My Chart to send questions or call the office at [phone]

## 2024-04-19 NOTE — Assessment & Plan Note (Addendum)
 Assessment: BP in office BP 129/88 mmHg. DBP slightly above the goal (<130/80). Home BP averaging 136/89 Current regimen well tolerated. Patient notes mild ankle swelling, likely related to amlodipine , but not severe. He is agreeable to increasing the dose and monitoring response. Denies SOB, palpitation, chest pain, headaches,or significant swelling Reiterated the importance of regular exercise and low salt diet  Reviewed impact of alcohol on overall health and blood pressure. Encouraged limiting intake to no more than 2 drinks per day for men (standard drink = 12 oz beer, 5 oz wine, or 1.5 oz spirits). Patient is agreeable to reducing alcohol consumption.  Plan:  Start amlodipine  10 mg daily; patient to contact clinic if swelling worsens. Continue lisinopril  20 mg daily. Consider increasing lisinopril  at next visit if additional blood pressure control is needed or if higher amlodipine  dose is not tolerated. Patient to maintain a log of blood pressure and heart rate readings and bring to next appointment Bring monitor for validation Follow-up with PharmD in 5-6 weeks.

## 2024-04-19 NOTE — Progress Notes (Signed)
 Patient ID: John Montoya                 DOB: [birth year]                      MRN: [id number]      HPI: John Montoya is a 44 y.o. male referred by Dr. Court to HTN clinic. PMH is significant for HTN and atypical chest pain.   Patient was last evaluated by Dr. Court in November 2025 for complaints of atypical chest pain. Coronary calcium score was 0 (06/03/2023). Coronary CTA showed no evidence of CAD. BP at that visit was elevated at 138/94; he was taking amlodipine  5 mg daily and lisinopril  20 mg daily at that time. Provider encouraged patient to monitor BP at home and follow up with PharmD to review and make adjustments in HTN regimen.  Patient is in good spirits today. He is currently taking amlodipine  5 mg daily and lisinopril  20 mg daily for hypertension management and reports good tolerance overall. He has noticed mild ankle swelling, which he suspects may be related to amlodipine , but it is not severe.  He reports experiencing significant job-related stress. We discussed the possibility of initiating an SSRI versus continuing his current buspirone  regimen, which he feels is ineffective. He will discuss this with his PCP. He denies any thoughts of harm or self-harm. He remains busy caring for his three children. He is looking forward to an upcoming vacation to the [place] this Friday.  He has blood pressure log but states that it is not consistent with checking his BP, averaging 136/89 at home.   Patient reports drinking more than average. This month, he has only had one drink so far, but typically consumes 0-2 drinks per day Monday through Thursday. If he has a work event, he will usually have 4 drinks daily. Fridays - Sundays, intake can reach 8-12 drinks in a day. He acknowledges that this may contribute to elevated blood pressure. Social factors include living near high school friends and attending work-related events, which often lead to increased alcohol consumption.   Current HTN meds:  amlodipine  5 mg daily and lisinopril  20 mg daily Previously tried: none BP goal: < 130/80 Labs (10/2023): Scr 1.27, Crcl: 92 ml/min  K 4.4  Family History:  His father had a heart attack at age 57.   Relation Problem Comments  Father   Brother Polycythemia (Age: 41)     Maternal Aunt - Connie Breast cancer dx <50  Liver cancer dx <50    Maternal Aunt Lung cancer     Maternal Uncle Colon cancer dx <50    Paternal Uncle Other non-cancerous brain tumors; dx <30    Maternal Grandmother Breast cancer mets; dx 38s    Maternal Great-grandfather (Deceased)     Social History:  Alcohol: 1 all this month, Monday - Thursday 0-2 drinks per day; work event will 4/day; Friday - Sunday 8-12 per day  Smoking: none   Diet:  Breakfast: Typically skips. Lunch/Dinner: Common meals include deli items, soup and sandwiches; dinners usually consist of turkey meat, tacos, or spaghetti, with minimal red meat. Snacks: Goldfish crackers. Beverages: Primarily water and unsweetened tea.  Exercise:  Patient previously ran marathons but stopped after the birth of his last child. He reports feeling much better during that time, and his blood pressure was within normal range. Currently, he exercises five days per week, completing 20 minutes of light cardio and approximately 1 hour and 20  minutes of strength training.   Home BP readings: No HR documented 12/1 - 133/91, 135/93 12/2 - 138/90, 141/95 12/3 - 133/90 12/4 - 136/89 12/6 - 139/90 12/7 - 136/88 12/8 - 135/92 12/11 - 139/88 12/15 - 141/89 12/16 - 130/80 12/21 - 137/82  Wt Readings from Last 3 Encounters:  02/24/24 225 lb (102.1 kg)  04/15/23 229 lb (103.9 kg)  12/23/22 225 lb 14.4 oz (102.5 kg)   BP Readings from Last 3 Encounters:  04/19/24 129/88  03/12/24 (!) 145/83  02/24/24 (!) 138/100   Pulse Readings from Last 3 Encounters:  04/19/24 85  03/12/24 65  02/24/24 71    Renal function: CrCl cannot be calculated (Patient's  most recent lab result is older than the maximum 21 days allowed.).  Past Medical History:  Diagnosis Date   Hypertension     Medications Ordered Prior to Encounter[1]  Allergies[2]  Blood pressure 129/88, pulse 85.   Assessment/Plan:  1. Hypertension -  Essential hypertension Assessment: BP in office BP 129/88 mmHg. DBP slightly above the goal (<130/80). Home BP averaging 136/89 Current regimen well tolerated. Patient notes mild ankle swelling, likely related to amlodipine , but not severe. He is agreeable to increasing the dose and monitoring response. Denies SOB, palpitation, chest pain, headaches,or significant swelling Reiterated the importance of regular exercise and low salt diet  Reviewed impact of alcohol on overall health and blood pressure. Encouraged limiting intake to no more than 2 drinks per day for men (standard drink = 12 oz beer, 5 oz wine, or 1.5 oz spirits). Patient is agreeable to reducing alcohol consumption.  Plan:  Start amlodipine  10 mg daily; patient to contact clinic if swelling worsens. Continue lisinopril  20 mg daily. Consider increasing lisinopril  at next visit if additional blood pressure control is needed or if higher amlodipine  dose is not tolerated. Patient to maintain a log of blood pressure and heart rate readings and bring to next appointment. Follow-up with PharmD in 5-6 weeks.      Thank you  Mrytle Bento E. Luis Sami, Pharm.D [facility]. [facility] [address], [place], [place] [address] Phone: [phone]; Fax: [phone]      [1]  Current Outpatient Medications on File Prior to Visit  Medication Sig Dispense Refill   busPIRone  (BUSPAR ) 5 MG tablet Take 5 mg by mouth 2 (two) times daily.     EPINEPHrine 0.3 mg/0.3 mL IJ SOAJ injection Inject 0.3 mg into the muscle as needed for anaphylaxis.     lisinopril  (ZESTRIL ) 20 MG tablet Take 20 mg by mouth daily.     metoprolol  tartrate  (LOPRESSOR ) 50 MG tablet Take 50 mg (1 tablet) TWO hours prior to CT scan 1 tablet 0   omeprazole (PRILOSEC) 40 MG capsule Take 40 mg by mouth daily.     No current facility-administered medications on file prior to visit.  [2]  Allergies Allergen Reactions   Bee Venom Anaphylaxis

## 2024-05-26 ENCOUNTER — Ambulatory Visit
